# Patient Record
Sex: Female | Born: 1990 | Race: White | Hispanic: No | Marital: Married | Smoking: Never smoker
Health system: Southern US, Community
[De-identification: ages and names within clinical notes are randomized; demographics above are authoritative.]

## PROBLEM LIST (undated history)

## (undated) DIAGNOSIS — Z803 Family history of malignant neoplasm of breast: Secondary | ICD-10-CM

## (undated) DIAGNOSIS — R011 Cardiac murmur, unspecified: Secondary | ICD-10-CM

## (undated) DIAGNOSIS — Z1371 Encounter for nonprocreative screening for genetic disease carrier status: Secondary | ICD-10-CM

## (undated) DIAGNOSIS — B977 Papillomavirus as the cause of diseases classified elsewhere: Secondary | ICD-10-CM

## (undated) DIAGNOSIS — D649 Anemia, unspecified: Secondary | ICD-10-CM

## (undated) HISTORY — PX: WISDOM TOOTH EXTRACTION: SHX21

## (undated) HISTORY — DX: Family history of malignant neoplasm of breast: Z80.3

## (undated) HISTORY — DX: Encounter for nonprocreative screening for genetic disease carrier status: Z13.71

## (undated) HISTORY — DX: Papillomavirus as the cause of diseases classified elsewhere: B97.7

## (undated) HISTORY — DX: Anemia, unspecified: D64.9

## (undated) HISTORY — DX: Cardiac murmur, unspecified: R01.1

---
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
## 2014-02-22 ENCOUNTER — Observation Stay: Payer: Self-pay | Admitting: Obstetrics and Gynecology

## 2014-02-22 LAB — URINALYSIS, COMPLETE
BLOOD: NEGATIVE
Bilirubin,UR: NEGATIVE
Glucose,UR: NEGATIVE mg/dL (ref 0–75)
Hyaline Cast: 1
KETONE: NEGATIVE
NITRITE: NEGATIVE
Ph: 7 (ref 4.5–8.0)
Protein: NEGATIVE
RBC,UR: 2 /HPF (ref 0–5)
Specific Gravity: 1.008 (ref 1.003–1.030)
Squamous Epithelial: 4

## 2014-03-07 ENCOUNTER — Observation Stay: Payer: Self-pay

## 2014-03-14 ENCOUNTER — Inpatient Hospital Stay: Payer: Self-pay

## 2014-03-14 LAB — PROTEIN / CREATININE RATIO, URINE
Creatinine, Urine: 35.8 mg/dL (ref 30.0–125.0)
PROTEIN/CREAT. RATIO: 503 mg/g{creat} — AB (ref 0–200)
Protein, Random Urine: 18 mg/dL — ABNORMAL HIGH (ref 0–12)

## 2014-03-14 LAB — PIH PROFILE
ANION GAP: 10 (ref 7–16)
BUN: 6 mg/dL — ABNORMAL LOW (ref 7–18)
Calcium, Total: 8.8 mg/dL (ref 8.5–10.1)
Chloride: 107 mmol/L (ref 98–107)
Co2: 25 mmol/L (ref 21–32)
Creatinine: 0.65 mg/dL (ref 0.60–1.30)
GLUCOSE: 75 mg/dL (ref 65–99)
HCT: 34.8 % — ABNORMAL LOW (ref 35.0–47.0)
HGB: 11.3 g/dL — ABNORMAL LOW (ref 12.0–16.0)
MCH: 29.2 pg (ref 26.0–34.0)
MCHC: 32.5 g/dL (ref 32.0–36.0)
MCV: 90 fL (ref 80–100)
Osmolality: 279 (ref 275–301)
Platelet: 241 10*3/uL (ref 150–440)
Potassium: 4.1 mmol/L (ref 3.5–5.1)
RBC: 3.88 10*6/uL (ref 3.80–5.20)
RDW: 15.2 % — AB (ref 11.5–14.5)
SGOT(AST): 19 U/L (ref 15–37)
Sodium: 142 mmol/L (ref 136–145)
Uric Acid: 4.5 mg/dL (ref 2.6–6.0)
WBC: 10.5 10*3/uL (ref 3.6–11.0)

## 2014-03-15 LAB — BASIC METABOLIC PANEL
ANION GAP: 10 (ref 7–16)
BUN: 6 mg/dL — ABNORMAL LOW (ref 7–18)
CO2: 20 mmol/L — AB (ref 21–32)
CREATININE: 0.54 mg/dL — AB (ref 0.60–1.30)
Calcium, Total: 8.4 mg/dL — ABNORMAL LOW (ref 8.5–10.1)
Chloride: 107 mmol/L (ref 98–107)
EGFR (Non-African Amer.): >60
Glucose: 89 mg/dL (ref 65–99)
OSMOLALITY: 271 (ref 275–301)
Potassium: 3.9 mmol/L (ref 3.5–5.1)
Sodium: 137 mmol/L (ref 136–145)

## 2014-03-15 LAB — URIC ACID: Uric Acid: 4.8 mg/dL (ref 2.6–6.0)

## 2014-03-15 LAB — RBC: RBC: 4.1 10*6/uL (ref 3.80–5.20)

## 2014-03-15 LAB — PLATELET COUNT: Platelet: 242 10*3/uL (ref 150–440)

## 2014-03-15 LAB — HEMATOCRIT: HCT: 36.9 % (ref 35.0–47.0)

## 2014-03-15 LAB — WBC: WBC: 16.5 10*3/uL — ABNORMAL HIGH (ref 3.6–11.0)

## 2014-03-15 LAB — HEMOGLOBIN: HGB: 11.8 g/dL — AB (ref 12.0–16.0)

## 2014-03-15 LAB — SGOT (AST)(ARMC): SGOT(AST): 18 U/L (ref 15–37)

## 2014-03-16 LAB — HEMATOCRIT: HCT: 31.2 % — ABNORMAL LOW (ref 35.0–47.0)

## 2014-10-25 NOTE — H&P (Signed)
L&D Evaluation:  History Expanded:  HPI 24 yo G1 with EDD of 03/19/14 per LMP & 8 wk [place], presents at [redacted]w[redacted]d with c/o contractions and leaking fluid. Denies VB or decreased FM. PNC at [facility], early entry to care, BMI 32 with normal early 1 hr. LLP resolved at 28 wks. RH negative, rhogam received on 7/24.   Blood Type (Maternal) O negative   Maternal HIV Negative   Maternal Syphilis Ab Nonreactive   Maternal Varicella Immune   Rubella Results (Maternal) immune   Maternal T-Dap Immune   Presents with contractions   Patient's Medical History No Chronic Illness   Patient's Surgical History wisdom teeth   Medications Pre Natal Vitamins   Allergies NKDA   Social History none   Exam:  Vital Signs stable   General no apparent distress   Mental Status clear   Abdomen gravid, tender with contractions   Estimated Fetal Weight Average for gestational age   Pelvic no external lesions, 1/50/-2 to -3, unchanged after 1 hour per RN   Mebranes Intact, nitrizine negative   FHT normal rate with no decels, category 1 tracing. baseline initially 150, then to 135, moderate variability, + accelerations, no decels   Ucx initially q 3 min, irregular after resting   Impression:  Impression IUP at [redacted]w[redacted]d with contractions, not in labor   Plan:  Comments Labor precautions.  Out of work/rest today   Follow Up Appointment already scheduled. 9/14   Electronic Signatures: Burlene Arnt K (CNM)  (Signed 08-Sep-15 10:30)  Authored: L&D Evaluation   Last Updated: 08-Sep-15 10:30 by Ander Purpura (CNM)

## 2015-05-19 ENCOUNTER — Encounter: Payer: Self-pay | Admitting: *Deleted

## 2015-05-22 ENCOUNTER — Ambulatory Visit: Payer: Self-pay | Attending: Oncology | Admitting: *Deleted

## 2015-05-22 ENCOUNTER — Encounter: Payer: Self-pay | Admitting: *Deleted

## 2015-05-22 VITALS — BP 128/88 | HR 86 | Temp 98.9°F

## 2015-05-22 DIAGNOSIS — N63 Unspecified lump in unspecified breast: Secondary | ICD-10-CM

## 2015-05-22 NOTE — Patient Instructions (Signed)
Gave patient hand-out, Women Staying Healthy, Active and Well from [facility], with education on breast health, pap smears, heart and colon health. 

## 2015-05-22 NOTE — Progress Notes (Signed)
Subjective:     Patient ID: Marie Stephenson, female   DOB: 11-29-1990, 24 y.o.   MRN: [id number]  HPI   Review of Systems     Objective:   Physical Exam  Pulmonary/Chest: Right breast exhibits no inverted nipple, no mass, no nipple discharge, no skin change and no tenderness. Left breast exhibits mass. Left breast exhibits no inverted nipple, no nipple discharge, no skin change and no tenderness. Breasts are symmetrical.         Assessment:     24 year old White female referred to [facility] by Laroy Apple, NP for financial assistance for further evaluation of a left breast mass.  Patient states she felt a mass about 1 year ago after breast feeding her son.  States she thought it was just a clogged duct.  States it has gotten larger over the past year, but without insurance she did not seek any medical attention until last week.  States the nodule is painful to palpation, or if she sleeps on her stomach.  States last week, she had radiating pain from the nodule to the left axilla.   On clinical breast exam, bilateral breast have diffuse firm, fibroglandular like tissue, making it more difficult to define the area of concern.  The patient stated you could feel it best in the sitting position.  The nodule is much more superficial in the semi-fowlers position.  There is a firm, approximate 1 cm nodule at 12:00 left breast 4-5 cm from the areola that the patient states is the area of concern.    Plan:   Patient has already been referred to Dr. Bary Castilla for further evaluation and possible ultrasound.   His office was notified by Jeanella Anton the patient would be a [facility] patient so the patient would not be billed.  Will follow-up per [facility] protocol and Dr. Bary Castilla.

## 2015-05-25 ENCOUNTER — Other Ambulatory Visit: Payer: PRIVATE HEALTH INSURANCE

## 2015-05-25 ENCOUNTER — Ambulatory Visit (INDEPENDENT_AMBULATORY_CARE_PROVIDER_SITE_OTHER): Payer: PRIVATE HEALTH INSURANCE | Admitting: General Surgery

## 2015-05-25 ENCOUNTER — Encounter: Payer: Self-pay | Admitting: General Surgery

## 2015-05-25 VITALS — BP 130/80 | HR 77 | Resp 14 | Ht 62.0 in | Wt 170.0 lb

## 2015-05-25 DIAGNOSIS — N63 Unspecified lump in breast: Secondary | ICD-10-CM

## 2015-05-25 DIAGNOSIS — N6320 Unspecified lump in the left breast, unspecified quadrant: Secondary | ICD-10-CM

## 2015-05-25 NOTE — Patient Instructions (Addendum)
Continue self breast exams. Call office for any new breast issues or concerns. Check the area once a month on the same day. Follow up in 3 months.

## 2015-05-25 NOTE — Progress Notes (Signed)
Patient ID: Marie Stephenson, female   DOB: 07/15/1990, 24 y.o.   MRN: [id number]  Chief Complaint  Patient presents with  . Other    left breast mass    HPI Marie Stephenson is a 24 y.o. female here today for a evaluation of a left breast mass. Patient noticed his area about a mass about 4 months ago, 8 months after she had stopped breast feeding her son.  She states the mass has got bigger. She reports some discomfort with this if she has slept on the left side. She reports one episode of radiating like pain in the mass going into her left arm pit that lasted about 2 hours. She is here today with her husband Marie Stephenson.   I personally reviewed the patient's history. HPI  Past Medical History  Diagnosis Date  . Heart murmur     since birth    No past surgical history on file.  Family History  Problem Relation Age of Onset  . Breast cancer Maternal Grandmother 22    great grandmother  . Leukemia Maternal Grandfather     Social History Social History  Substance Use Topics  . Smoking status: Never Smoker   . Smokeless tobacco: None  . Alcohol Use: 0.0 oz/week    0 Standard drinks or equivalent per week    No Known Allergies  Current Outpatient Prescriptions  Medication Sig Dispense Refill  . norgestrel-ethinyl estradiol (LO/OVRAL,CRYSELLE) 0.3-30 MG-MCG tablet Take 1 tablet by mouth daily.     No current facility-administered medications for this visit.    Review of Systems Review of Systems  Constitutional: Negative.   Respiratory: Negative.   Cardiovascular: Negative.     Blood pressure 130/80, pulse 77, resp. rate 14, height 5\' 2"  (1.575 m), weight 170 lb (77.111 kg), last menstrual period 04/23/2015.  Physical Exam Physical Exam  Constitutional: She is oriented to person, place, and time. She appears well-developed and well-nourished.  Eyes: Conjunctivae are normal. No scleral icterus.  Neck: Neck supple.  Cardiovascular: Normal rate and  regular rhythm.   Murmur heard.  Systolic murmur is present with a grade of 1/6  Pulmonary/Chest: Effort normal and breath sounds normal. Right breast exhibits no inverted nipple, no mass, no nipple discharge, no skin change and no tenderness. Left breast exhibits no inverted nipple, no mass, no nipple discharge, no skin change and no tenderness.    Lymphadenopathy:    She has no cervical adenopathy.    She has no axillary adenopathy.  Neurological: She is alert and oriented to person, place, and time.  Skin: Skin is warm and dry.  Psychiatric: She has a normal mood and affect.    Data Reviewed Ultrasound examination of the upper portion of the left breast was completed to better assess the vague asymmetry on exam in the 11:00 position noted by the patient. No evidence of cystic or solid lesions. No architectural distortion. No vascular flow abnormalities. No images. BI-RADS-1.  Assessment    Focal prominent fat in the left breast, no evidence of underlying breast pathology.    Plan    The patient has been examining the area daily or every other day at the least. This ongoing "poking" may be keeping the area prominent, and has been discouraged. She should do monthly self-exam. Should she develop pain (not presently evident until she presses on the area) she appreciates a demonstrable change on a monthly self-exam she'll follow up early. Otherwise will have a "backstop" exam in 3  months.  The patient's husband was present for the interview, exam and discussion.    PCP: none Ref: Bertram Denver 05/26/2015, 7:57 AM

## 2015-05-26 DIAGNOSIS — N6320 Unspecified lump in the left breast, unspecified quadrant: Secondary | ICD-10-CM | POA: Insufficient documentation

## 2015-05-30 ENCOUNTER — Encounter: Payer: Self-pay | Admitting: *Deleted

## 2015-05-30 NOTE — Progress Notes (Signed)
Patient seen by Dr. Bary Castilla.  Benign findings.  No further follow-up needed.  HSIS to [place].

## 2015-08-22 ENCOUNTER — Telehealth: Payer: Self-pay | Admitting: *Deleted

## 2015-08-22 NOTE — Telephone Encounter (Signed)
Pt called to cancel her appointment for tomorrow afternoon, 08/23/15 stating that Dr. Bary Castilla had discussed with her that if she was feeling better, was not having any problems that she didn't need to be seen for her scheduled appointment. She stated that she was not having any problems but would call our office as needed.

## 2015-08-23 ENCOUNTER — Ambulatory Visit: Payer: PRIVATE HEALTH INSURANCE | Admitting: General Surgery

## 2016-10-16 ENCOUNTER — Telehealth: Payer: Self-pay | Admitting: Advanced Practice Midwife

## 2016-10-16 ENCOUNTER — Other Ambulatory Visit: Payer: Self-pay

## 2016-10-16 MED ORDER — NORGESTREL-ETHINYL ESTRADIOL 0.3-30 MG-MCG PO TABS: 1.0000 | ORAL_TABLET | Freq: Every day | ORAL | 0 refills | Status: DC

## 2016-10-16 NOTE — Telephone Encounter (Signed)
rx refilled pt aware

## 2016-10-16 NOTE — Telephone Encounter (Signed)
Pot is schedule when Rod Can for 11/20/16 and is requestinga refill on her Birthcontrol to make to her appt. Pt use [facility] in [place]

## 2016-11-20 ENCOUNTER — Ambulatory Visit (INDEPENDENT_AMBULATORY_CARE_PROVIDER_SITE_OTHER): Payer: Self-pay | Admitting: Advanced Practice Midwife

## 2016-11-20 ENCOUNTER — Encounter: Payer: Self-pay | Admitting: Advanced Practice Midwife

## 2016-11-20 VITALS — BP 118/70 | Ht 61.0 in | Wt 140.0 lb

## 2016-11-20 DIAGNOSIS — Z01419 Encounter for gynecological examination (general) (routine) without abnormal findings: Secondary | ICD-10-CM

## 2016-11-20 DIAGNOSIS — Z0000 Encounter for general adult medical examination without abnormal findings: Secondary | ICD-10-CM

## 2016-11-20 NOTE — Progress Notes (Signed)
Patient ID: Marie Stephenson, female   DOB: May 02, 1991, 26 y.o.   MRN: [id number]     Gynecology Annual Exam  PCP: Patient, No Pcp Per  Chief Complaint:  Chief Complaint  Patient presents with  . Annual Exam    History of Present Illness: Patient is a 26 y.o. G1P1001 presents for annual exam. The patient has no complaints today. She has made a decision to discontinue use of hormonal birth control. She has been on hormones since she was 26 years old and wants to have a hormone free time. She discontinued use of OCPs last month following her last period. She has questions about vasectomy. Discussion of the issues.  LMP: Patient's last menstrual period was 10/20/2016. Menarche:9 Average Interval: regular, 28 days Duration of flow: 4 days Heavy Menses: no Clots: no Intermenstrual Bleeding: no Postcoital Bleeding: no Dysmenorrhea: no  The patient is sexually active. She currently uses none for contraception. She denies dyspareunia. The patient does perform self breast exams.  There is notable family history of breast or ovarian cancer in her family. Her maternal great grandmother had breast cancer in her 58s.   The patient wears seatbelts: yes.  The patient has regular exercise: yes.  The patient admits to healthy diet- ketogenic and to adequate hydration. She is a Psychologist, counselling.  The patient denies  current symptoms of depression.    Review of Systems: Review of Systems  Constitutional: Negative.   HENT: Negative.   Eyes: Negative.   Respiratory: Negative.   Cardiovascular: Negative.   Gastrointestinal: Negative.   Genitourinary: Negative.   Musculoskeletal: Negative.   Skin: Negative.   Neurological: Negative.   Endo/Heme/Allergies: Negative.   Psychiatric/Behavioral: Negative.     Past Medical History:  Past Medical History:  Diagnosis Date  . Heart murmur    since birth  . Human papilloma virus     Past Surgical History:  Past Surgical  History:  Procedure Laterality Date  . WISDOM TOOTH EXTRACTION      Gynecologic History:  Patient's last menstrual period was 10/20/2016. Contraception: none Last Pap: Results were: unsatisfactory. The PAP before that was NIL. She has a prior history of abnormal PAP with positive HPV followed by a normal PAP.  Obstetric History: G1P1001  Family History:  Family History  Problem Relation Age of Onset  . Endometriosis Mother   . Hypertension Mother   . Breast cancer Maternal Grandmother 32       great grandmother  . Leukemia Maternal Grandfather   . Hypertension Maternal Grandfather     Social History:  Social History   Social History  . Marital status: Married    Spouse name: N/A  . Number of children: N/A  . Years of education: N/A   Occupational History  . Not on file.   Social History Main Topics  . Smoking status: Never Smoker  . Smokeless tobacco: Never Used  . Alcohol use 0.0 oz/week  . Drug use: No  . Sexual activity: Yes   Other Topics Concern  . Not on file   Social History Narrative  . No narrative on file    Allergies:  No Known Allergies  Medications: Prior to Admission medications   Medication Sig Start Date End Date Taking? Authorizing Provider  norgestrel-ethinyl estradiol (LO/OVRAL,CRYSELLE) 0.3-30 MG-MCG tablet Take 1 tablet by mouth daily. Patient not taking: Reported on 11/20/2016 10/16/16   Rod Can, CNM    Physical Exam Vitals: Blood pressure 118/70, height 5\' 1"  (  1.549 m), weight 140 lb (63.5 kg), last menstrual period 10/20/2016.  General: NAD HEENT: normocephalic, anicteric Thyroid: no enlargement, no palpable nodules Pulmonary: No increased work of breathing, CTAB Cardiovascular: RRR, distal pulses 2+ Breast: Breast symmetrical, no tenderness, no palpable nodules or masses, no skin or nipple retraction present, no nipple discharge.  No axillary or supraclavicular lymphadenopathy. Abdomen: NABS, soft, non-tender,  non-distended.  Umbilicus without lesions.  No hepatomegaly, splenomegaly or masses palpable. No evidence of hernia  Genitourinary: deferred for no complaints and no PAP done Extremities: no edema, erythema, or tenderness Neurologic: Grossly intact Psychiatric: mood appropriate, affect full   Assessment: 26 y.o. G1P1001 Well woman exam  Plan: Problem List Items Addressed This Visit    None    Visit Diagnoses    Well woman exam with routine gynecological exam    -  Primary   Relevant Orders   CBC with Differential/Platelet   Lipid panel   Blood tests for routine general physical examination       Relevant Orders   CBC with Differential/Platelet   Lipid panel      1) 4) Gardasil Series discussed and if applicable offered to patient - Patient has previously completed 3 shot series   2) STI screening was offered and declined   3) [facility] guidelines and rational discussed.  Patient opts for yearly screening interval. Patient declines PAP today due to no insurance   4) Contraception: patient chooses to use no hormonal birth control or copper IUD. She plans to use condoms for now  5) Continue healthy lifestyle diet and exercise   6) Follow up 1 year for routine annual exam   Rod Can, CNM

## 2016-11-21 LAB — CBC WITH DIFFERENTIAL/PLATELET
BASOS: 0 %
Basophils Absolute: 0 10*3/uL (ref 0.0–0.2)
EOS (ABSOLUTE): 0.1 10*3/uL (ref 0.0–0.4)
Eos: 1 %
Hematocrit: 43.1 % (ref 34.0–46.6)
Hemoglobin: 14.8 g/dL (ref 11.1–15.9)
IMMATURE GRANS (ABS): 0 10*3/uL (ref 0.0–0.1)
Immature Granulocytes: 0 %
LYMPHS: 41 %
Lymphocytes Absolute: 2.1 10*3/uL (ref 0.7–3.1)
MCH: 31 pg (ref 26.6–33.0)
MCHC: 34.3 g/dL (ref 31.5–35.7)
MCV: 90 fL (ref 79–97)
MONOS ABS: 0.3 10*3/uL (ref 0.1–0.9)
Monocytes: 6 %
Neutrophils Absolute: 2.6 10*3/uL (ref 1.4–7.0)
Neutrophils: 52 %
Platelets: 253 10*3/uL (ref 150–379)
RBC: 4.77 x10E6/uL (ref 3.77–5.28)
RDW: 13.1 % (ref 12.3–15.4)
WBC: 5 10*3/uL (ref 3.4–10.8)

## 2016-11-21 LAB — LIPID PANEL
Chol/HDL Ratio: 3.7 ratio (ref 0.0–4.4)
Cholesterol, Total: 215 mg/dL — ABNORMAL HIGH (ref 100–199)
HDL: 58 mg/dL (ref >39)
LDL Calculated: 147 mg/dL — ABNORMAL HIGH (ref 0–99)
TRIGLYCERIDES: 48 mg/dL (ref 0–149)
VLDL CHOLESTEROL CAL: 10 mg/dL (ref 5–40)

## 2018-05-09 ENCOUNTER — Encounter: Payer: Self-pay | Admitting: Gynecology

## 2018-05-09 ENCOUNTER — Ambulatory Visit (INDEPENDENT_AMBULATORY_CARE_PROVIDER_SITE_OTHER)
Admission: EM | Admit: 2018-05-09 | Discharge: 2018-05-09 | Disposition: A | Discharge status: Home / Self Care | Payer: PRIVATE HEALTH INSURANCE | Source: Home / Self Care | Attending: Family Medicine | Admitting: Family Medicine

## 2018-05-09 ENCOUNTER — Other Ambulatory Visit: Payer: Self-pay

## 2018-05-09 DIAGNOSIS — R1084 Generalized abdominal pain: Secondary | ICD-10-CM | POA: Insufficient documentation

## 2018-05-09 DIAGNOSIS — R197 Diarrhea, unspecified: Secondary | ICD-10-CM | POA: Insufficient documentation

## 2018-05-09 DIAGNOSIS — R110 Nausea: Secondary | ICD-10-CM | POA: Diagnosis not present

## 2018-05-09 DIAGNOSIS — K8050 Calculus of bile duct without cholangitis or cholecystitis without obstruction: Secondary | ICD-10-CM

## 2018-05-09 DIAGNOSIS — R1011 Right upper quadrant pain: Secondary | ICD-10-CM | POA: Diagnosis present

## 2018-05-09 LAB — CBC WITH DIFFERENTIAL/PLATELET
Abs Immature Granulocytes: 0.02 10*3/uL (ref 0.00–0.07)
BASOS ABS: 0 10*3/uL (ref 0.0–0.1)
Basophils Relative: 0 %
EOS ABS: 0.1 10*3/uL (ref 0.0–0.5)
EOS PCT: 1 %
HEMATOCRIT: 40.2 % (ref 36.0–46.0)
Hemoglobin: 14.1 g/dL (ref 12.0–15.0)
Immature Granulocytes: 0 %
LYMPHS ABS: 1.8 10*3/uL (ref 0.7–4.0)
Lymphocytes Relative: 24 %
MCH: 30.9 pg (ref 26.0–34.0)
MCHC: 35.1 g/dL (ref 30.0–36.0)
MCV: 88.2 fL (ref 80.0–100.0)
MONO ABS: 0.4 10*3/uL (ref 0.1–1.0)
Monocytes Relative: 6 %
NRBC: 0 % (ref 0.0–0.2)
Neutro Abs: 5.1 10*3/uL (ref 1.7–7.7)
Neutrophils Relative %: 69 %
Platelets: 249 10*3/uL (ref 150–400)
RBC: 4.56 MIL/uL (ref 3.87–5.11)
RDW: 11.9 % (ref 11.5–15.5)
WBC: 7.3 10*3/uL (ref 4.0–10.5)

## 2018-05-09 LAB — COMPREHENSIVE METABOLIC PANEL
ALK PHOS: 35 U/L — AB (ref 38–126)
ALT: 11 U/L (ref 0–44)
AST: 12 U/L — AB (ref 15–41)
Albumin: 4.1 g/dL (ref 3.5–5.0)
Anion gap: 7 (ref 5–15)
BUN: 18 mg/dL (ref 6–20)
CALCIUM: 9.1 mg/dL (ref 8.9–10.3)
CO2: 29 mmol/L (ref 22–32)
CREATININE: 0.71 mg/dL (ref 0.44–1.00)
Chloride: 102 mmol/L (ref 98–111)
Glucose, Bld: 89 mg/dL (ref 70–99)
Potassium: 4.1 mmol/L (ref 3.5–5.1)
SODIUM: 138 mmol/L (ref 135–145)
Total Bilirubin: 0.7 mg/dL (ref 0.3–1.2)
Total Protein: 6.9 g/dL (ref 6.5–8.1)

## 2018-05-09 NOTE — ED Triage Notes (Signed)
Per patient with abdominal pain radiates to her ride side. Per patient with diarrhea x 4 days

## 2018-05-09 NOTE — ED Triage Notes (Signed)
Patient reports right upper quad abdominal pain that not radiates to mid abdomen.  States seen at Urgent Care earlier in day and told probably bilary but instructed to come to ED if continued.

## 2018-05-09 NOTE — Discharge Instructions (Signed)
Recommend clear liquids for today, then advance diet slowly as tolerated, starting with bland foods; avoid fatty foods Over the counter tylenol/ibuprofen as needed

## 2018-05-09 NOTE — ED Provider Notes (Signed)
[facility]    CSN: [id number] Arrival date & time: 05/09/18  1342     History   Chief Complaint Chief Complaint  Patient presents with  . Abdominal Pain    HPI Marie Stephenson is a 27 y.o. female.   The history is provided by the patient.  Abdominal Pain  Pain location:  RUQ Pain quality: cramping   Pain radiates to:  R flank Pain severity:  Mild Onset quality:  Sudden Duration:  4 days Timing:  Constant Progression:  Worsening Chronicity:  New Context: not alcohol use, not awakening from sleep, not diet changes, not eating, not laxative use, not medication withdrawal, not previous surgeries, not recent illness, not recent sexual activity, not recent travel, not retching, not sick contacts, not suspicious food intake and not trauma   Relieved by:  None tried Ineffective treatments:  None tried Associated symptoms: diarrhea (watery) and nausea   Associated symptoms: no anorexia, no belching, no chest pain, no chills, no constipation, no cough, no dysuria, no fatigue, no fever, no flatus, no hematemesis, no hematochezia, no hematuria, no melena, no shortness of breath, no sore throat, no vaginal bleeding, no vaginal discharge and no vomiting   Risk factors: no alcohol abuse, no aspirin use, not elderly, has not had multiple surgeries, no NSAID use, not obese, not pregnant and no recent hospitalization     Past Medical History:  Diagnosis Date  . Heart murmur    since birth  . Human papilloma virus     Patient Active Problem List   Diagnosis Date Noted  . Breast mass, left 05/26/2015    Past Surgical History:  Procedure Laterality Date  . WISDOM TOOTH EXTRACTION      OB History    Gravida  1   Para  1   Term  1   Preterm      AB      Living  1     SAB      TAB      Ectopic      Multiple      Live Births  1        Obstetric Comments  Menstrual age: 65  Age 1st Pregnancy: 71          Home Medications     Prior to Admission medications   Not on File    Family History Family History  Problem Relation Age of Onset  . Endometriosis Mother   . Hypertension Mother   . Breast cancer Maternal Grandmother 68       great grandmother  . Leukemia Maternal Grandfather   . Hypertension Maternal Grandfather     Social History Social History   Tobacco Use  . Smoking status: Never Smoker  . Smokeless tobacco: Never Used  Substance Use Topics  . Alcohol use: Yes    Alcohol/week: 0.0 standard drinks  . Drug use: No     Allergies   Patient has no known allergies.   Review of Systems Review of Systems  Constitutional: Negative for chills, fatigue and fever.  HENT: Negative for sore throat.   Respiratory: Negative for cough and shortness of breath.   Cardiovascular: Negative for chest pain.  Gastrointestinal: Positive for abdominal pain, diarrhea (watery) and nausea. Negative for anorexia, constipation, flatus, hematemesis, hematochezia, melena and vomiting.  Genitourinary: Negative for dysuria, hematuria, vaginal bleeding and vaginal discharge.     Physical Exam Triage Vital Signs ED Triage Vitals  Enc Vitals Group  BP --      Pulse Rate 05/09/18 1359 80     Resp 05/09/18 1359 16     Temp 05/09/18 1359 98.4 F (36.9 C)     Temp Source 05/09/18 1359 Oral     SpO2 05/09/18 1359 100 %     Weight 05/09/18 1400 142 lb (64.4 kg)     Height 05/09/18 1400 5\' 1"  (1.549 m)     Head Circumference --      Peak Flow --      Pain Score 05/09/18 1400 4     Pain Loc --      Pain Edu? --      Excl. in [place]? --    No data found.  Updated Vital Signs Pulse 80   Temp 98.4 F (36.9 C) (Oral)   Resp 16   Ht 5\' 1"  (1.549 m)   Wt 64.4 kg   LMP 05/08/2018   SpO2 100%   BMI 26.83 kg/m   Visual Acuity Right Eye Distance:   Left Eye Distance:   Bilateral Distance:    Right Eye Near:   Left Eye Near:    Bilateral Near:     Physical Exam  Constitutional: She appears  well-developed and well-nourished. No distress.  Abdominal: Soft. Bowel sounds are normal. She exhibits no distension and no mass. There is tenderness (mild right upper quadrant; no rebound or guarding). There is no rebound and no guarding.  Skin: She is not diaphoretic.  Nursing note and vitals reviewed.    UC Treatments / Results  Labs (all labs ordered are listed, but only abnormal results are displayed) Labs Reviewed  COMPREHENSIVE METABOLIC PANEL - Abnormal; Notable for the following components:      Result Value   AST 12 (*)    Alkaline Phosphatase 35 (*)    All other components within normal limits  CBC WITH DIFFERENTIAL/PLATELET    EKG None  Radiology No results found.  Procedures Procedures (including critical care time)  Medications Ordered in UC Medications - No data to display  Initial Impression / Assessment and Plan / UC Course  I have reviewed the triage vital signs and the nursing notes.  Pertinent labs & imaging results that were available during my care of the patient were reviewed by me and considered in my medical decision making (see chart for details).      Final Clinical Impressions(s) / UC Diagnoses   Final diagnoses:  Biliary colic     Discharge Instructions     Recommend clear liquids for today, then advance diet slowly as tolerated, starting with bland foods; avoid fatty foods Over the counter tylenol/ibuprofen as needed    ED Prescriptions    None     1. Lab results and diagnosis reviewed with patient 2 Recommend supportive treatment as above 3. Follow-up prn if symptoms worsen or don't improve   Controlled Substance Prescriptions [place] Controlled Substance Registry consulted? Not Applicable   Norval Gable, MD 05/09/18 1558

## 2018-05-10 ENCOUNTER — Emergency Department: Payer: PRIVATE HEALTH INSURANCE

## 2018-05-10 ENCOUNTER — Emergency Department
Admission: EM | Admit: 2018-05-10 | Discharge: 2018-05-10 | Disposition: A | Discharge status: Home / Self Care | Payer: PRIVATE HEALTH INSURANCE | Attending: Emergency Medicine | Admitting: Emergency Medicine

## 2018-05-10 ENCOUNTER — Encounter: Payer: Self-pay | Admitting: Radiology

## 2018-05-10 DIAGNOSIS — K529 Noninfective gastroenteritis and colitis, unspecified: Secondary | ICD-10-CM

## 2018-05-10 DIAGNOSIS — R110 Nausea: Secondary | ICD-10-CM

## 2018-05-10 DIAGNOSIS — R1011 Right upper quadrant pain: Secondary | ICD-10-CM

## 2018-05-10 DIAGNOSIS — R1084 Generalized abdominal pain: Secondary | ICD-10-CM

## 2018-05-10 DIAGNOSIS — R197 Diarrhea, unspecified: Secondary | ICD-10-CM

## 2018-05-10 LAB — GASTROINTESTINAL PANEL BY PCR, STOOL (REPLACES STOOL CULTURE)
ADENOVIRUS F40/41: NOT DETECTED
ASTROVIRUS: NOT DETECTED
CAMPYLOBACTER SPECIES: NOT DETECTED
CRYPTOSPORIDIUM: NOT DETECTED
CYCLOSPORA CAYETANENSIS: NOT DETECTED
ENTAMOEBA HISTOLYTICA: NOT DETECTED
ENTEROPATHOGENIC E COLI (EPEC): NOT DETECTED
ENTEROTOXIGENIC E COLI (ETEC): NOT DETECTED
Enteroaggregative E coli (EAEC): NOT DETECTED
Giardia lamblia: NOT DETECTED
Norovirus GI/GII: NOT DETECTED
PLESIMONAS SHIGELLOIDES: NOT DETECTED
Rotavirus A: NOT DETECTED
Salmonella species: NOT DETECTED
Sapovirus (I, II, IV, and V): NOT DETECTED
Shiga like toxin producing E coli (STEC): NOT DETECTED
Shigella/Enteroinvasive E coli (EIEC): NOT DETECTED
VIBRIO CHOLERAE: NOT DETECTED
VIBRIO SPECIES: NOT DETECTED
YERSINIA ENTEROCOLITICA: NOT DETECTED

## 2018-05-10 LAB — COMPREHENSIVE METABOLIC PANEL
ALBUMIN: 4 g/dL (ref 3.5–5.0)
ALT: 11 U/L (ref 0–44)
AST: 11 U/L — AB (ref 15–41)
Alkaline Phosphatase: 34 U/L — ABNORMAL LOW (ref 38–126)
Anion gap: 10 (ref 5–15)
BUN: 18 mg/dL (ref 6–20)
CALCIUM: 8.8 mg/dL — AB (ref 8.9–10.3)
CO2: 28 mmol/L (ref 22–32)
Chloride: 102 mmol/L (ref 98–111)
Creatinine, Ser: 0.69 mg/dL (ref 0.44–1.00)
GFR calc Af Amer: >60 mL/min (ref >60)
GFR calc non Af Amer: >60 mL/min (ref >60)
GLUCOSE: 90 mg/dL (ref 70–99)
Potassium: 3.8 mmol/L (ref 3.5–5.1)
SODIUM: 140 mmol/L (ref 135–145)
TOTAL PROTEIN: 6.5 g/dL (ref 6.5–8.1)
Total Bilirubin: 0.5 mg/dL (ref 0.3–1.2)

## 2018-05-10 LAB — C DIFFICILE QUICK SCREEN W PCR REFLEX
C DIFFICLE (CDIFF) ANTIGEN: NEGATIVE
C Diff interpretation: NOT DETECTED
C Diff toxin: NEGATIVE

## 2018-05-10 LAB — URINALYSIS, COMPLETE (UACMP) WITH MICROSCOPIC
Bilirubin Urine: NEGATIVE
Glucose, UA: NEGATIVE mg/dL
KETONES UR: 5 mg/dL — AB
LEUKOCYTES UA: NEGATIVE
Nitrite: NEGATIVE
PH: 6 (ref 5.0–8.0)
PROTEIN: NEGATIVE mg/dL
SPECIFIC GRAVITY, URINE: 1.018 (ref 1.005–1.030)

## 2018-05-10 LAB — CBC
HCT: 44.8 % (ref 36.0–46.0)
Hemoglobin: 15.6 g/dL — ABNORMAL HIGH (ref 12.0–15.0)
MCH: 31 pg (ref 26.0–34.0)
MCHC: 34.8 g/dL (ref 30.0–36.0)
MCV: 88.9 fL (ref 80.0–100.0)
NRBC: 0 % (ref 0.0–0.2)
Platelets: 277 10*3/uL (ref 150–400)
RBC: 5.04 MIL/uL (ref 3.87–5.11)
RDW: 11.8 % (ref 11.5–15.5)
WBC: 8.9 10*3/uL (ref 4.0–10.5)

## 2018-05-10 LAB — PREGNANCY, URINE: Preg Test, Ur: NEGATIVE

## 2018-05-10 LAB — LIPASE, BLOOD: Lipase: 47 U/L (ref 11–51)

## 2018-05-10 MED ORDER — CIPROFLOXACIN HCL 500 MG PO TABS: 500.0000 mg | ORAL_TABLET | Freq: Two times a day (BID) | ORAL | 0 refills | Status: DC

## 2018-05-10 MED ORDER — SODIUM CHLORIDE 0.9 % IV BOLUS
1000.0000 mL | Freq: Once | INTRAVENOUS | Status: AC
  Administered 2018-05-10: 1000 mL via INTRAVENOUS

## 2018-05-10 MED ORDER — IOHEXOL 300 MG/ML  SOLN
75.0000 mL | Freq: Once | INTRAMUSCULAR | Status: AC | PRN
  Administered 2018-05-10: 75 mL via INTRAVENOUS

## 2018-05-10 MED ORDER — ONDANSETRON 4 MG PO TBDP: 4.0000 mg | ORAL_TABLET | Freq: Three times a day (TID) | ORAL | 0 refills | Status: DC | PRN

## 2018-05-10 MED ORDER — DICYCLOMINE HCL 20 MG PO TABS: 20.0000 mg | ORAL_TABLET | Freq: Four times a day (QID) | ORAL | 0 refills | Status: DC | PRN

## 2018-05-10 MED ORDER — CIPROFLOXACIN HCL 500 MG PO TABS
500.0000 mg | ORAL_TABLET | Freq: Once | ORAL | Status: AC
  Administered 2018-05-10: 500 mg via ORAL
  Filled 2018-05-10: qty 1

## 2018-05-10 MED ORDER — ONDANSETRON HCL 4 MG/2ML IJ SOLN
4.0000 mg | Freq: Once | INTRAMUSCULAR | Status: DC
  Filled 2018-05-10: qty 2

## 2018-05-10 MED ORDER — MORPHINE SULFATE (PF) 4 MG/ML IV SOLN
4.0000 mg | Freq: Once | INTRAVENOUS | Status: DC
  Filled 2018-05-10: qty 1

## 2018-05-10 MED ORDER — IOPAMIDOL (ISOVUE-300) INJECTION 61%
30.0000 mL | Freq: Once | INTRAVENOUS | Status: AC | PRN
  Administered 2018-05-10: 30 mL via ORAL

## 2018-05-10 NOTE — ED Notes (Signed)
Pt says currently her pain is 4/10; when she has "an episode", her pain is 7/10

## 2018-05-10 NOTE — ED Notes (Addendum)
Upper right Abdominal pain started Friday night. Pt also has had diarrhea x 4 days. Feeling like cramping. Pt alert and oriented x 4. Family at bed. Pt went to urgent care yesterday and recommended if pain gets worse to go to ER. Tender to touch. PT family has Hx of IBS

## 2018-05-10 NOTE — ED Notes (Signed)
Pt to US via wheelchair.

## 2018-05-10 NOTE — Discharge Instructions (Signed)
1.  Take antibiotic as prescribed (Cipro 500 mg twice daily x5 days). 2.  You may take medicines as needed for abdominal discomfort and nausea (Bentyl/Zofran #20). 3.  BRAT diet x5 days, then slowly advance diet as tolerated. 4.  Drink plenty of fluids daily. 5.  Return to the ER for worsening symptoms, persistent vomiting, difficulty breathing or other concerns.

## 2018-05-10 NOTE — ED Provider Notes (Signed)
[facility] Emergency Department Provider Note   ____________________________________________   First MD Initiated Contact with Patient 05/10/18 [phone]     (approximate)  I have reviewed the triage vital signs and the nursing notes.   HISTORY  Chief Complaint Abdominal Pain    HPI Marie Stephenson is a 27 y.o. female sent to the ED from urgent care for evaluation of biliary colic.  Patient reports a 4-day history of right upper quadrant abdominal pain mainly at night.  Over the course of the 4 days, pain is now generalized.  Symptoms associated with nausea and diarrhea.  Denies associated fever, chills, chest pain, shortness of breath, dysuria.  Denies recent travel, trauma or antibiotic use.   Past Medical History:  Diagnosis Date  . Heart murmur    since birth  . Human papilloma virus     Patient Active Problem List   Diagnosis Date Noted  . Breast mass, left 05/26/2015    Past Surgical History:  Procedure Laterality Date  . WISDOM TOOTH EXTRACTION      Prior to Admission medications   Medication Sig Start Date End Date Taking? Authorizing Provider  ciprofloxacin (CIPRO) 500 MG tablet Take 1 tablet (500 mg total) by mouth 2 (two) times daily. 05/10/18   Paulette Blanch, MD  dicyclomine (BENTYL) 20 MG tablet Take 1 tablet (20 mg total) by mouth every 6 (six) hours as needed. 05/10/18   Paulette Blanch, MD  ondansetron (ZOFRAN ODT) 4 MG disintegrating tablet Take 1 tablet (4 mg total) by mouth every 8 (eight) hours as needed for nausea or vomiting. 05/10/18   Paulette Blanch, MD    Allergies Patient has no known allergies.  Family History  Problem Relation Age of Onset  . Endometriosis Mother   . Hypertension Mother   . Breast cancer Maternal Grandmother 64       great grandmother  . Leukemia Maternal Grandfather   . Hypertension Maternal Grandfather     Social History Social History   Tobacco Use  . Smoking status: Never Smoker    . Smokeless tobacco: Never Used  Substance Use Topics  . Alcohol use: Yes    Alcohol/week: 0.0 standard drinks  . Drug use: No    Review of Systems  Constitutional: No fever/chills Eyes: No visual changes. ENT: No sore throat. Cardiovascular: Denies chest pain. Respiratory: Denies shortness of breath. Gastrointestinal: Nauseated for abdominal pain and nausea, no vomiting.  Positive for diarrhea.  No constipation. Genitourinary: Negative for dysuria. Musculoskeletal: Negative for back pain. Skin: Negative for rash. Neurological: Negative for headaches, focal weakness or numbness.   ____________________________________________   PHYSICAL EXAM:  VITAL SIGNS: ED Triage Vitals  Enc Vitals Group     BP 05/09/18 2338 138/76     Pulse Rate 05/09/18 2338 98     Resp 05/09/18 2338 18     Temp 05/09/18 2338 98.6 F (37 C)     Temp Source 05/09/18 2338 Oral     SpO2 05/09/18 2338 97 %     Weight 05/09/18 2337 142 lb (64.4 kg)     Height 05/09/18 2337 5\' 1"  (1.549 m)     Head Circumference --      Peak Flow --      Pain Score 05/09/18 2337 8     Pain Loc --      Pain Edu? --      Excl. in [place]? --     Constitutional: Alert and oriented.  Well appearing and in no acute distress. Eyes: Conjunctivae are normal. PERRL. EOMI. Head: Atraumatic. Nose: No congestion/rhinnorhea. Mouth/Throat: Mucous membranes are moist.  Oropharynx non-erythematous. Neck: No stridor.   Cardiovascular: Normal rate, regular rhythm. Grossly normal heart sounds.  Good peripheral circulation. Respiratory: Normal respiratory effort.  No retractions. Lungs CTAB. Gastrointestinal: Soft and mildly tender to palpation diffusely without rebound or guarding. No distention. No abdominal bruits. No CVA tenderness. Musculoskeletal: No lower extremity tenderness nor edema.  No joint effusions. Neurologic:  Normal speech and language. No gross focal neurologic deficits are appreciated. No gait instability. Skin:   Skin is warm, dry and intact. No rash noted. Psychiatric: Mood and affect are normal. Speech and behavior are normal.  ____________________________________________   LABS (all labs ordered are listed, but only abnormal results are displayed)  Labs Reviewed  COMPREHENSIVE METABOLIC PANEL - Abnormal; Notable for the following components:      Result Value   Calcium 8.8 (*)    AST 11 (*)    Alkaline Phosphatase 34 (*)    All other components within normal limits  CBC - Abnormal; Notable for the following components:   Hemoglobin 15.6 (*)    All other components within normal limits  URINALYSIS, COMPLETE (UACMP) WITH MICROSCOPIC - Abnormal; Notable for the following components:   Color, Urine YELLOW (*)    APPearance CLOUDY (*)    Hgb urine dipstick LARGE (*)    Ketones, ur 5 (*)    Bacteria, UA RARE (*)    All other components within normal limits  C DIFFICILE QUICK SCREEN W PCR REFLEX  GASTROINTESTINAL PANEL BY PCR, STOOL (REPLACES STOOL CULTURE)  LIPASE, BLOOD  PREGNANCY, URINE   ____________________________________________  EKG  None ____________________________________________  RADIOLOGY  ED MD interpretation: Unremarkable ultrasound; CT demonstrates enteritis  Official radiology report(s): Ct Abdomen Pelvis W Contrast  Result Date: 05/10/2018 CLINICAL DATA:  RIGHT abdominal pain since Friday night, diarrhea for 4 days, cramping EXAM: CT ABDOMEN AND PELVIS WITH CONTRAST TECHNIQUE: Multidetector CT imaging of the abdomen and pelvis was performed using the standard protocol following bolus administration of intravenous contrast. CONTRAST:  27mL OMNIPAQUE IOHEXOL 300 MG/ML  SOLN COMPARISON:  None FINDINGS: Lower chest: Lung bases clear Hepatobiliary: Gallbladder and liver normal appearance Pancreas: Normal appearance Spleen: Normal appearance Adrenals/Urinary Tract: Adrenal glands normal appearance. Small LEFT renal cyst and BILATERAL nonobstructing renal calculi. No  hydronephrosis or ureteral abnormalities. Well distended otherwise normal appearing urinary bladder. Stomach/Bowel: Bowel wall thickening of multiple small bowel loops in the RIGHT mid abdomen and upper pelvis associated with mesenteric edema. Findings are consistent with enteritis. Stomach decompressed and unremarkable. Colon and remaining small bowel loops normal appearance. Normal appendix opacified by contrast though mild peri appendiceal infiltration is noted, likely secondary. Vascular/Lymphatic: Vascular structures patent on non targeted exam. Aorta normal caliber. Few normal sized mesenteric lymph nodes without abdominal or pelvic adenopathy. Reproductive: Unremarkable uterus and ovaries Other: Free fluid in pelvis, RIGHT pericolic gutter, interloop, and perihepatic. No free air or hernia. Musculoskeletal: Unremarkable IMPRESSION: Bowel wall thickening of multiple small bowel loops in the mid abdomen and upper pelvis compatible with enteritis; differential diagnosis would include infection and inflammatory bowel disease. Associated scattered free intraperitoneal fluid without free air to suggest perforation. Electronically Signed   By: Lavonia Dana M.D.   On: 05/10/2018 07:16   US Abdomen Limited Ruq  Result Date: 05/10/2018 CLINICAL DATA:  Right upper quadrant pain for 4 days. EXAM: ULTRASOUND ABDOMEN LIMITED RIGHT UPPER QUADRANT COMPARISON:  None. FINDINGS: Gallbladder: No gallstones or wall thickening visualized. No sonographic Murphy sign noted by sonographer. Common bile duct: Diameter: 3.9 mm, normal Liver: No focal lesion identified. Within normal limits in parenchymal echogenicity. Portal vein is patent on color Doppler imaging with normal direction of blood flow towards the liver. IMPRESSION: Normal examination. Electronically Signed   By: Lucienne Capers M.D.   On: 05/10/2018 02:24    ____________________________________________   PROCEDURES  Procedure(s) performed:  None  Procedures  Critical Care performed: No  ____________________________________________   INITIAL IMPRESSION / ASSESSMENT AND PLAN / ED COURSE  As part of my medical decision making, I reviewed the following data within the [place] notes reviewed and incorporated, Labs reviewed, Radiograph reviewed and Notes from prior ED visits   27 year old female who presents with abdominal pain, nausea and diarrhea.  Family history of Crohn's disease. Differential diagnosis includes, but is not limited to, biliary disease (biliary colic, acute cholecystitis, cholangitis, choledocholithiasis, etc), intrathoracic causes for epigastric abdominal pain including ACS, gastritis, duodenitis, pancreatitis, small bowel or large bowel obstruction, abdominal aortic aneurysm, hernia, ulcer(s), IBD, IBS, diverticulitis, colitis, etc.  Laboratory results unremarkable.  Ultrasound negative for biliary disease.  Will initiate IV fluid resuscitation, administer 4 mg IV morphine for pain paired with 4 mg IV Zofran for nausea.  Will proceed with CT abdomen/pelvis.  Collect stool specimen if patient is able to produce.   Clinical Course as of May 10 732  Nancy Fetter May 10, 2018  0720 Patient on CT result.  Awaiting rest of stool specimen.  We discussed if those are unremarkable, we can empirically place her on Cipro for symptomatic relief.  Anticipate discharge home after results of bio fire panel.   [JS]  [id number] Bio fire has returned negative.  Will discharge home with GI follow-up.  Prescriptions for Cipro, Bentyl and Zofran to use as needed.  Strict return precautions given.  Patient and family members verbalized understanding and agree with plan of care.   [JS]    Clinical Course User Index [JS] Paulette Blanch, MD     ____________________________________________   FINAL CLINICAL IMPRESSION(S) / ED DIAGNOSES  Final diagnoses:  Generalized abdominal pain  Diarrhea, unspecified type   Nausea  Enteritis     ED Discharge Orders         Ordered    dicyclomine (BENTYL) 20 MG tablet  Every 6 hours PRN     05/10/18 0723    ciprofloxacin (CIPRO) 500 MG tablet  2 times daily     05/10/18 0723    ondansetron (ZOFRAN ODT) 4 MG disintegrating tablet  Every 8 hours PRN     05/10/18 5465           Note:  This document was prepared using Dragon voice recognition software and may include unintentional dictation errors.    Paulette Blanch, MD 05/10/18 [phone]

## 2018-08-14 ENCOUNTER — Encounter: Payer: Self-pay | Admitting: Advanced Practice Midwife

## 2018-08-14 ENCOUNTER — Other Ambulatory Visit (HOSPITAL_COMMUNITY)
Admission: RE | Admit: 2018-08-14 | Discharge: 2018-08-14 | Disposition: A | Discharge status: Home / Self Care | Payer: PRIVATE HEALTH INSURANCE | Source: Ambulatory Visit | Attending: Advanced Practice Midwife | Admitting: Advanced Practice Midwife

## 2018-08-14 ENCOUNTER — Ambulatory Visit (INDEPENDENT_AMBULATORY_CARE_PROVIDER_SITE_OTHER): Payer: PRIVATE HEALTH INSURANCE | Admitting: Advanced Practice Midwife

## 2018-08-14 VITALS — BP 114/70 | Ht 61.0 in | Wt 138.0 lb

## 2018-08-14 DIAGNOSIS — Z8639 Personal history of other endocrine, nutritional and metabolic disease: Secondary | ICD-10-CM

## 2018-08-14 DIAGNOSIS — Z113 Encounter for screening for infections with a predominantly sexual mode of transmission: Secondary | ICD-10-CM

## 2018-08-14 DIAGNOSIS — Z01419 Encounter for gynecological examination (general) (routine) without abnormal findings: Secondary | ICD-10-CM | POA: Diagnosis not present

## 2018-08-14 DIAGNOSIS — Z124 Encounter for screening for malignant neoplasm of cervix: Secondary | ICD-10-CM | POA: Insufficient documentation

## 2018-08-14 DIAGNOSIS — Z0000 Encounter for general adult medical examination without abnormal findings: Secondary | ICD-10-CM

## 2018-08-14 NOTE — Progress Notes (Signed)
Patient ID: Marie Stephenson, female   DOB: 08/03/1990, 28 y.o.   MRN: [id number]     Gynecology Annual Exam   PCP: Patient, No Pcp Per  Chief Complaint:  Chief Complaint  Patient presents with  . Annual Exam    History of Present Illness: Patient is a 29 y.o. G1P1001 presents for annual exam. The patient has complaint today of a mole that is near her anus that she has known of for approximately 2 years. She denies any itching, irritation or pain. She has not seen a dermatologist. She reports feeling well since being off birth control hormones for the past 1.5 years. She requests hormone testing today due to past history of hormone imbalance.   LMP: Patient's last menstrual period was 07/10/2018. Average Interval: regular, 28-35 days Duration of flow: 5 days Heavy Menses: no Clots: no Intermenstrual Bleeding: no Postcoital Bleeding: no Dysmenorrhea: no  The patient is sexually active. She currently uses condoms for contraception. She denies dyspareunia.  The patient does perform self breast exams.  There is notable family history of breast or ovarian cancer in her family. Her maternal great grandmother was diagnosed with breast cancer in her 61's. Patient accepts [facility] testing today.  The patient wears seatbelts: yes.   The patient has regular exercise: She works out- mostly lifting and some cardio- 5-6 days per week. She admits healthy diet and adequate hydration. She admits 6-7 hours of sleep per night.    The patient denies current symptoms of depression.    Review of Systems: Review of Systems  Constitutional: Negative.   HENT: Negative.   Eyes: Negative.   Respiratory: Negative.   Cardiovascular: Negative.   Gastrointestinal: Negative.   Genitourinary: Negative.   Musculoskeletal: Negative.   Skin:       Mole at anus  Neurological: Negative.   Endo/Heme/Allergies:       Easy bruising  Psychiatric/Behavioral: Negative.     Past Medical History:  Past  Medical History:  Diagnosis Date  . Heart murmur    since birth  . Human papilloma virus     Past Surgical History:  Past Surgical History:  Procedure Laterality Date  . WISDOM TOOTH EXTRACTION      Gynecologic History:  Patient's last menstrual period was 07/10/2018. Contraception: condoms Last Pap: 07/19/2015 Results were: unsatisfactory for evaluation   Obstetric History: G1P1001  Family History:  Family History  Problem Relation Age of Onset  . Endometriosis Mother   . Hypertension Mother   . Breast cancer Maternal Grandmother 87       great grandmother  . Leukemia Maternal Grandfather   . Hypertension Maternal Grandfather     Social History:  Social History   Socioeconomic History  . Marital status: Married    Spouse name: Not on file  . Number of children: Not on file  . Years of education: Not on file  . Highest education level: Not on file  Occupational History  . Not on file  Social Needs  . Financial resource strain: Not on file  . Food insecurity:    Worry: Not on file    Inability: Not on file  . Transportation needs:    Medical: Not on file    Non-medical: Not on file  Tobacco Use  . Smoking status: Never Smoker  . Smokeless tobacco: Never Used  Substance and Sexual Activity  . Alcohol use: Yes    Alcohol/week: 0.0 standard drinks  . Drug use: No  . Sexual activity:  Yes    Birth control/protection: Condom  Lifestyle  . Physical activity:    Days per week: Not on file    Minutes per session: Not on file  . Stress: Not on file  Relationships  . Social connections:    Talks on phone: Not on file    Gets together: Not on file    Attends religious service: Not on file    Active member of club or organization: Not on file    Attends meetings of clubs or organizations: Not on file    Relationship status: Not on file  . Intimate partner violence:    Fear of current or ex partner: Not on file    Emotionally abused: Not on file    Physically  abused: Not on file    Forced sexual activity: Not on file  Other Topics Concern  . Not on file  Social History Narrative  . Not on file    Allergies:  No Known Allergies  Medications: Prior to Admission medications   Not on File    Physical Exam Vitals: Blood pressure 114/70, height 5\' 1"  (1.549 m), weight 138 lb (62.6 kg), last menstrual period 07/10/2018.  General: NAD HEENT: normocephalic, anicteric Thyroid: no enlargement, no palpable nodules Pulmonary: No increased work of breathing, CTAB Cardiovascular: RRR, distal pulses 2+ Breast: Breast symmetrical, no tenderness, no palpable nodules or masses, no skin or nipple retraction present, no nipple discharge.  No axillary or supraclavicular lymphadenopathy. Abdomen: NABS, soft, non-tender, non-distended.  Umbilicus without lesions.  No hepatomegaly, splenomegaly or masses palpable. No evidence of hernia  Genitourinary:  External: Normal external female genitalia.  Normal urethral meatus, normal Bartholin's and Skene's glands.    Vagina: Normal vaginal mucosa, no evidence of prolapse.    Cervix: Grossly normal in appearance, increased vascularity, no bleeding  Uterus: deferred for no concerns  Adnexa: deferred for no concerns  Rectal: deferred  Anus: 3 mm circular raised mole, black, non-tender to palpation  Lymphatic: no evidence of inguinal lymphadenopathy Extremities: no edema, erythema, or tenderness Neurologic: Grossly intact Psychiatric: mood appropriate, affect full    Assessment: 28 y.o. G1P1001 routine annual exam  Plan: Problem List Items Addressed This Visit    None    Visit Diagnoses    Well woman exam with routine gynecological exam    -  Primary   Relevant Orders   Cytology - PAP   STD Panel   Screen for sexually transmitted diseases       Relevant Orders   Cytology - PAP   STD Panel   Cervical cancer screening       Relevant Orders   Cytology - PAP      1) STI screening  was offered and  accepted  2)  ASCCP guidelines and rational discussed.  Patient opts for every 3 years screening interval  3) Contraception - the patient is currently using  condoms.  She is happy with her current form of contraception and plans to continue. She and her husband are still considering vasectomy  4) Routine healthcare maintenance including cholesterol, diabetes screening discussed Ordered today   5) See dermatologist for any worsening of mole  6) Return in 1 year (on 08/15/2019).   Rod Can, [facility], [facility] 08/14/2018, 9:46 AM

## 2018-08-14 NOTE — Patient Instructions (Signed)

## 2018-08-16 DIAGNOSIS — Z1371 Encounter for nonprocreative screening for genetic disease carrier status: Secondary | ICD-10-CM

## 2018-08-16 DIAGNOSIS — Z803 Family history of malignant neoplasm of breast: Secondary | ICD-10-CM

## 2018-08-16 HISTORY — DX: Encounter for nonprocreative screening for genetic disease carrier status: Z13.71

## 2018-08-16 HISTORY — DX: Family history of malignant neoplasm of breast: Z80.3

## 2018-08-18 LAB — ESTRADIOL: ESTRADIOL: 90.1 pg/mL

## 2018-08-18 LAB — RPR+HSVIGM+HBSAG+HSV2(IGG)+...
HIV SCREEN 4TH GENERATION: NONREACTIVE
HSV 2 IgG, Type Spec: <0.91 index (ref 0.00–0.90)
HSVI/II Comb IgM: <0.91 Ratio (ref 0.00–0.90)
Hepatitis B Surface Ag: NEGATIVE
RPR: NONREACTIVE

## 2018-08-18 LAB — COMPREHENSIVE METABOLIC PANEL
A/G RATIO: 2.3 — AB (ref 1.2–2.2)
ALK PHOS: 39 IU/L (ref 39–117)
ALT: 16 IU/L (ref 0–32)
AST: 18 IU/L (ref 0–40)
Albumin: 4.8 g/dL (ref 3.9–5.0)
BILIRUBIN TOTAL: 0.4 mg/dL (ref 0.0–1.2)
BUN/Creatinine Ratio: 20 (ref 9–23)
BUN: 15 mg/dL (ref 6–20)
CHLORIDE: 103 mmol/L (ref 96–106)
CO2: 22 mmol/L (ref 20–29)
Calcium: 9 mg/dL (ref 8.7–10.2)
Creatinine, Ser: 0.76 mg/dL (ref 0.57–1.00)
GFR calc non Af Amer: 108 mL/min/{1.73_m2} (ref >59)
GFR, EST AFRICAN AMERICAN: 124 mL/min/{1.73_m2} (ref >59)
Globulin, Total: 2.1 g/dL (ref 1.5–4.5)
Glucose: 73 mg/dL (ref 65–99)
POTASSIUM: 4.9 mmol/L (ref 3.5–5.2)
Sodium: 139 mmol/L (ref 134–144)
Total Protein: 6.9 g/dL (ref 6.0–8.5)

## 2018-08-18 LAB — CBC
Hematocrit: 40.8 % (ref 34.0–46.6)
Hemoglobin: 14.3 g/dL (ref 11.1–15.9)
MCH: 31.7 pg (ref 26.6–33.0)
MCHC: 35 g/dL (ref 31.5–35.7)
MCV: 91 fL (ref 79–97)
PLATELETS: 260 10*3/uL (ref 150–450)
RBC: 4.51 x10E6/uL (ref 3.77–5.28)
RDW: 12.2 % (ref 11.7–15.4)
WBC: 5.1 10*3/uL (ref 3.4–10.8)

## 2018-08-18 LAB — LIPID PANEL WITH LDL/HDL RATIO
Cholesterol, Total: 176 mg/dL (ref 100–199)
HDL: 51 mg/dL (ref >39)
LDL Calculated: 117 mg/dL — ABNORMAL HIGH (ref 0–99)
LDl/HDL Ratio: 2.3 ratio (ref 0.0–3.2)
TRIGLYCERIDES: 39 mg/dL (ref 0–149)
VLDL CHOLESTEROL CAL: 8 mg/dL (ref 5–40)

## 2018-08-18 LAB — CYTOLOGY - PAP
Diagnosis: NEGATIVE
HPV: NOT DETECTED

## 2018-08-18 LAB — PROGESTERONE: Progesterone: 8.5 ng/mL

## 2018-08-18 LAB — TESTOSTERONE: Testosterone: 26 ng/dL (ref 8–48)

## 2018-08-26 ENCOUNTER — Encounter: Payer: Self-pay | Admitting: Obstetrics and Gynecology

## 2019-01-05 NOTE — Telephone Encounter (Signed)
Opal Sidles reached out to me since I handle [facility], BUT [facility] has nothing to do with [facility]. Pt can call ins to check where this bill is coming from???

## 2019-04-14 ENCOUNTER — Encounter: Payer: Self-pay | Admitting: Maternal Newborn

## 2019-04-14 ENCOUNTER — Other Ambulatory Visit (HOSPITAL_COMMUNITY)
Admission: RE | Admit: 2019-04-14 | Discharge: 2019-04-14 | Disposition: A | Discharge status: Home / Self Care | Payer: 59 | Source: Ambulatory Visit | Attending: Maternal Newborn | Admitting: Maternal Newborn

## 2019-04-14 ENCOUNTER — Ambulatory Visit (INDEPENDENT_AMBULATORY_CARE_PROVIDER_SITE_OTHER): Payer: 59 | Admitting: Maternal Newborn

## 2019-04-14 ENCOUNTER — Other Ambulatory Visit: Payer: Self-pay

## 2019-04-14 VITALS — BP 90/60 | Wt 159.0 lb

## 2019-04-14 DIAGNOSIS — Z3480 Encounter for supervision of other normal pregnancy, unspecified trimester: Secondary | ICD-10-CM | POA: Insufficient documentation

## 2019-04-14 DIAGNOSIS — Z369 Encounter for antenatal screening, unspecified: Secondary | ICD-10-CM

## 2019-04-14 DIAGNOSIS — Z3A01 Less than 8 weeks gestation of pregnancy: Secondary | ICD-10-CM

## 2019-04-14 DIAGNOSIS — N912 Amenorrhea, unspecified: Secondary | ICD-10-CM

## 2019-04-14 DIAGNOSIS — O0990 Supervision of high risk pregnancy, unspecified, unspecified trimester: Secondary | ICD-10-CM | POA: Insufficient documentation

## 2019-04-14 DIAGNOSIS — O09291 Supervision of pregnancy with other poor reproductive or obstetric history, first trimester: Secondary | ICD-10-CM

## 2019-04-14 LAB — POCT URINALYSIS DIPSTICK OB
Glucose, UA: NEGATIVE
POC,PROTEIN,UA: NEGATIVE

## 2019-04-14 NOTE — Progress Notes (Signed)
04/14/2019   Chief Complaint: Desires prenatal care.  Transfer of Care Patient: No  History of Present Illness: Marie Stephenson is a 28 y.o. G2P1001 at 40w5dbased on Patient's last menstrual period on 02/26/2019 (exact date), with an Estimated Date of Delivery: 12/03/2019, with the above CC.   Her periods were: regular periods every 28-29 days, lasting 4-5 days.   She has Positive signs or symptoms of nausea of pregnancy. She has Negative signs or symptoms of miscarriage or preterm labor She identifies Negative Zika risk factors for her and her partner On any different medications around the time she conceived/early pregnancy: No  History of varicella: Yes   Review of Systems  Constitutional: Positive for malaise/fatigue.       Change in appetite  HENT: Negative.   Eyes: Negative.   Respiratory: Negative for shortness of breath and wheezing.   Cardiovascular: Negative for chest pain and palpitations.  Gastrointestinal: Positive for nausea. Negative for vomiting.  Genitourinary: Negative.   Musculoskeletal: Negative.   Skin: Negative.   Neurological: Negative.   Endo/Heme/Allergies: Negative.   Psychiatric/Behavioral: Negative.   Breasts: Positive for breast tenderness.  Review of systems was otherwise negative, except as stated in the above HPI.  OBGYN History: As per HPI. OB History  Gravida Para Term Preterm AB Living  2 1 1     1   SAB TAB Ectopic Multiple Live Births          1    # Outcome Date GA Lbr Len/2nd Weight Sex Delivery Anes PTL Lv  2 Current           1 Term     M Vag-Spont   LIV    Obstetric Comments  Menstrual age: [age]   Age 1st Pregnancy: 89   Any issues with any prior pregnancies: yes, pre-eclampsia with G1 Any prior children are healthy, doing well, without any problems or issues: yes History of pap smears: Yes. Last pap smear 08/14/2018. NILM/HPV Negative History of STIs: None except for past positive HPV.   Past Medical History: Past Medical  History:  Diagnosis Date  . BRCA negative 08/2018   MyRisk neg except CDK4 VUS  . Family history of breast cancer 08/2018   IBIS=11%/riskscore=8.4%  . Heart murmur    since birth  . Human papilloma virus     Past Surgical History: Past Surgical History:  Procedure Laterality Date  . WISDOM TOOTH EXTRACTION      Family History:  Family History  Problem Relation Age of Onset  . Endometriosis Mother   . Hypertension Mother   . Breast cancer Maternal Grandmother 365      great grandmother  . Dementia Maternal Grandmother   . Leukemia Maternal Grandfather   . Hypertension Maternal Grandfather    She does not have a history of any female cancers, bleeding or blood clotting disorders.  There is no history of intellectual disability, birth defects or genetic disorders in her or the FOB's history.  Social History:  Social History   Socioeconomic History  . Marital status: Married    Spouse name: Not on file  . Number of children: 1  . Years of education: 141 . Highest education level: Not on file  Occupational History  . 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 Financial resource strain: Not on file  . Food insecurity    Worry: Not on file    Inability: Not on  file  . Transportation needs    Medical: Not on file    Non-medical: Not on file  Tobacco Use  . Smoking status: Never Smoker  . Smokeless tobacco: Never Used  Substance and Sexual Activity  . Alcohol use: Not Currently    Alcohol/week: 0.0 standard drinks  . Drug use: No  . Sexual activity: Yes    Birth control/protection: None  Lifestyle  . Physical activity    Days per week: Not on file    Minutes per session: Not on file  . Stress: Not on file  Relationships  . Social Herbalist on phone: Not on file    Gets together: Not on file    Attends religious service: Not on file    Active member of club or organization: Not on file    Attends meetings of  clubs or organizations: Not on file    Relationship status: Not on file  . Intimate partner violence    Fear of current or ex partner: Not on file    Emotionally abused: Not on file    Physically abused: Not on file    Forced sexual activity: Not on file  Other Topics Concern  . Not on file  Social History Narrative  . Not on file   Any cats in the household: yes, aware not to be in contact with cat litter/feces to avoid risk of toxoplasmosis. Domestic violence screening is negative.  Allergy: No Known Allergies  Current Outpatient Medications: No current outpatient medications on file.   Physical Exam:   BP 90/60   Wt 159 lb (72.1 kg)   LMP 02/26/2019 (Exact Date)   BMI 30.04 kg/m  Body mass index is 30.04 kg/m. Constitutional: Well nourished, well developed female in no acute distress.  Neck:  Supple, normal appearance, and no thyromegaly  Cardiovascular: S1, S2 normal, no murmur, rub or gallop, regular rate and rhythm Respiratory:  Clear to auscultation bilaterally. Normal respiratory effort Abdomen: No masses, hernias; diffusely non tender to palpation, non distended Breasts: breasts appear normal, no suspicious masses, no skin or nipple changes or axillary nodes. Neuro/Psych:  Normal mood and affect.  Skin:  Warm and dry.  Lymphatic:  No inguinal lymphadenopathy.   Pelvic exam: is not limited by body habitus External genitalia, Bartholin's glands, Urethra, Skene's glands: within normal limits Vagina: within normal limits and with no blood in the vault  Cervix: not visualized, closed/long/high, no CMT Uterus:  enlarged, normal contour Adnexa:  no mass, fullness; mild tenderness on left side  Assessment: Marie Stephenson is a 82 y.o. G2P1001 at 72w5dbased on Patient's last menstrual period on 02/26/2019 (exact date), with an Estimated Date of Delivery: 12/03/2019, presenting for prenatal care.  Plan:  1) Avoid alcoholic beverages. 2) Patient encouraged not to smoke.   3) Discontinue the use of all non-medicinal drugs and chemicals.  4) Take prenatal vitamins daily.  5) Seatbelt use advised 6) Nutrition, food safety, and exercise discussed. 7) [facility]  8) Patient is asked about travel to areas at risk for the ZAmesvirus, and counseled to avoid travel and exposure to mosquitoes or  partners who may have themselves been exposed to the virus.  10) Genetic Screening, such as with 1st Trimester Screening, cell free fetal DNA, AFP testing, and Ultrasound, is discussed with patient. She plans to decline genetic testing this pregnancy. 11) NOB labs today, dating scan ordered.  Problem list reviewed and updated.  Avel Sensor, CNM [facility], [facility] 04/14/2019  9:45 AM

## 2019-04-14 NOTE — Progress Notes (Signed)
No problems.rj 

## 2019-04-15 ENCOUNTER — Encounter: Payer: Self-pay | Admitting: Maternal Newborn

## 2019-04-15 DIAGNOSIS — O09299 Supervision of pregnancy with other poor reproductive or obstetric history, unspecified trimester: Secondary | ICD-10-CM | POA: Insufficient documentation

## 2019-04-15 LAB — RPR+RH+ABO+RUB AB+AB SCR+CB...
Antibody Screen: NEGATIVE
HIV Screen 4th Generation wRfx: NONREACTIVE
Hematocrit: 40.9 % (ref 34.0–46.6)
Hemoglobin: 14 g/dL (ref 11.1–15.9)
Hepatitis B Surface Ag: NEGATIVE
MCH: 30.4 pg (ref 26.6–33.0)
MCHC: 34.2 g/dL (ref 31.5–35.7)
MCV: 89 fL (ref 79–97)
Platelets: 234 10*3/uL (ref 150–450)
RBC: 4.61 x10E6/uL (ref 3.77–5.28)
RDW: 11.8 % (ref 11.7–15.4)
RPR Ser Ql: NONREACTIVE
Rh Factor: NEGATIVE
Rubella Antibodies, IGG: 3.42 index (ref >0.99)
Varicella zoster IgG: 929 index (ref >165)
WBC: 6.2 10*3/uL (ref 3.4–10.8)

## 2019-04-15 LAB — URINE DRUG PANEL 7
Amphetamines, Urine: NEGATIVE ng/mL
Barbiturate Quant, Ur: NEGATIVE ng/mL
Benzodiazepine Quant, Ur: NEGATIVE ng/mL
Cannabinoid Quant, Ur: NEGATIVE ng/mL
Cocaine (Metab.): NEGATIVE ng/mL
Opiate Quant, Ur: NEGATIVE ng/mL
PCP Quant, Ur: NEGATIVE ng/mL

## 2019-04-16 LAB — CERVICOVAGINAL ANCILLARY ONLY
Chlamydia: NEGATIVE
Comment: NEGATIVE
Comment: NORMAL
Neisseria Gonorrhea: NEGATIVE

## 2019-04-16 LAB — URINE CULTURE: Organism ID, Bacteria: NO GROWTH

## 2019-04-21 ENCOUNTER — Ambulatory Visit (INDEPENDENT_AMBULATORY_CARE_PROVIDER_SITE_OTHER): Payer: 59 | Admitting: Certified Nurse Midwife

## 2019-04-21 ENCOUNTER — Ambulatory Visit (INDEPENDENT_AMBULATORY_CARE_PROVIDER_SITE_OTHER): Payer: 59

## 2019-04-21 ENCOUNTER — Other Ambulatory Visit: Payer: Self-pay

## 2019-04-21 ENCOUNTER — Encounter: Payer: Self-pay | Admitting: Certified Nurse Midwife

## 2019-04-21 VITALS — BP 110/60 | Wt 147.0 lb

## 2019-04-21 DIAGNOSIS — Z3A01 Less than 8 weeks gestation of pregnancy: Secondary | ICD-10-CM

## 2019-04-21 DIAGNOSIS — O0990 Supervision of high risk pregnancy, unspecified, unspecified trimester: Secondary | ICD-10-CM

## 2019-04-21 DIAGNOSIS — O30041 Twin pregnancy, dichorionic/diamniotic, first trimester: Secondary | ICD-10-CM

## 2019-04-21 DIAGNOSIS — O30049 Twin pregnancy, dichorionic/diamniotic, unspecified trimester: Secondary | ICD-10-CM

## 2019-04-21 DIAGNOSIS — Z369 Encounter for antenatal screening, unspecified: Secondary | ICD-10-CM

## 2019-04-21 DIAGNOSIS — N8311 Corpus luteum cyst of right ovary: Secondary | ICD-10-CM

## 2019-04-21 DIAGNOSIS — Z6791 Unspecified blood type, Rh negative: Secondary | ICD-10-CM

## 2019-04-21 DIAGNOSIS — O3481 Maternal care for other abnormalities of pelvic organs, first trimester: Secondary | ICD-10-CM | POA: Diagnosis not present

## 2019-04-21 DIAGNOSIS — O0991 Supervision of high risk pregnancy, unspecified, first trimester: Secondary | ICD-10-CM

## 2019-04-21 DIAGNOSIS — O26891 Other specified pregnancy related conditions, first trimester: Secondary | ICD-10-CM

## 2019-04-21 LAB — POCT URINALYSIS DIPSTICK OB
Glucose, UA: NEGATIVE
POC,PROTEIN,UA: NEGATIVE

## 2019-04-21 MED ORDER — FOLIC ACID 1 MG PO TABS: 1.0000 mg | ORAL_TABLET | Freq: Every day | ORAL | 3 refills | Status: DC

## 2019-04-21 NOTE — Progress Notes (Signed)
C/O TWINS!!  What to expect as far as 'high risk' goes.rj

## 2019-05-09 ENCOUNTER — Encounter: Payer: Self-pay | Admitting: Certified Nurse Midwife

## 2019-05-09 DIAGNOSIS — O26899 Other specified pregnancy related conditions, unspecified trimester: Secondary | ICD-10-CM | POA: Insufficient documentation

## 2019-05-09 DIAGNOSIS — O30049 Twin pregnancy, dichorionic/diamniotic, unspecified trimester: Secondary | ICD-10-CM | POA: Insufficient documentation

## 2019-05-09 DIAGNOSIS — Z6791 Unspecified blood type, Rh negative: Secondary | ICD-10-CM | POA: Insufficient documentation

## 2019-05-09 NOTE — Progress Notes (Signed)
ROB at 7wk5d: Has twins on dating scan today. They appear to be dichorionic/ diamniotic with  Twin A measuring 7wk1d and FCA 142 Twin B measuring 7wk2d and FCA 148  Discussed twin gestation and risks associated with carring a twin gestation. Also discussed increased surveillance with growth scans  and antepartum testing Given ACOG pamphlet on twins Add extra folic acid 1 mgm to prenatal vitamins Desires cell free DNA testing when she returns in 4 weeks  Dalia Heading, CNM

## 2019-05-19 ENCOUNTER — Other Ambulatory Visit: Payer: Self-pay

## 2019-05-19 ENCOUNTER — Encounter: Payer: Self-pay | Admitting: Obstetrics & Gynecology

## 2019-05-19 ENCOUNTER — Ambulatory Visit (INDEPENDENT_AMBULATORY_CARE_PROVIDER_SITE_OTHER): Payer: 59 | Admitting: Obstetrics & Gynecology

## 2019-05-19 VITALS — BP 100/70 | Wt 151.0 lb

## 2019-05-19 DIAGNOSIS — O30041 Twin pregnancy, dichorionic/diamniotic, first trimester: Secondary | ICD-10-CM

## 2019-05-19 DIAGNOSIS — O30049 Twin pregnancy, dichorionic/diamniotic, unspecified trimester: Secondary | ICD-10-CM

## 2019-05-19 DIAGNOSIS — Z3A11 11 weeks gestation of pregnancy: Secondary | ICD-10-CM

## 2019-05-19 DIAGNOSIS — O0990 Supervision of high risk pregnancy, unspecified, unspecified trimester: Secondary | ICD-10-CM

## 2019-05-19 DIAGNOSIS — O26891 Other specified pregnancy related conditions, first trimester: Secondary | ICD-10-CM

## 2019-05-19 DIAGNOSIS — Z6791 Unspecified blood type, Rh negative: Secondary | ICD-10-CM

## 2019-05-19 DIAGNOSIS — O0991 Supervision of high risk pregnancy, unspecified, first trimester: Secondary | ICD-10-CM

## 2019-05-19 NOTE — Progress Notes (Signed)
  Subjective  Fetal Movement? no Min nausea  Objective  BP 100/70   Wt 151 lb (68.5 kg)   LMP 02/26/2019 (Exact Date)   BMI 28.53 kg/m  General: NAD Pumonary: no increased work of breathing Abdomen: gravid, non-tender Extremities: no edema Psychiatric: mood appropriate, affect full  Assessment  28 y.o. G2P1001 at [redacted]w[redacted]d by  12/03/2019, by Last Menstrual Period presenting for routine prenatal visit  Plan   Problem List Items Addressed This Visit      Other   Supervision of high risk pregnancy, antepartum   Dichorionic diamniotic twin pregnancy, antepartum   Rh negative state in antepartum period    Other Visit Diagnoses    [redacted] weeks gestation of pregnancy    -  Primary      SECOND Problems (from 04/14/19 to present)    Problem Noted Resolved   History of pre-eclampsia in prior pregnancy, currently pregnant 04/15/2019 by Rexene Agent, CNM No   Supervision of high risk pregnancy, antepartum 04/14/2019 by Rexene Agent, CNM No   Overview Addendum 05/19/2019  9:27 AM by Gae Dry, MD    Clinic [facility] Prenatal Labs  Dating LMP, [place] Blood type: O/Negative/-- (10/28 1022)   Genetic Screen NIPS: Antibody:Negative (10/28 1022)  Anatomic [place]  Rubella: 3.42 (10/28 1022) Varicella:  Immune  GTT    Third trimester:  RPR: Non Reactive (10/28 1022)   Rhogam Needs HBsAg: Negative (10/28 1022)   TDaP vaccine                   Flu Shot: HIV: Non Reactive (10/28 1022)   Baby Food                                GBS:   Contraception  Pap: 08/14/2018, NILM/HPV Negative  CBB  no   CS/VBAC no   Support Person                Pan[facility]ama today per pt request as advertises it reveals zygosity and identical vs fraternal twin status  Barnett Applebaum, MD, [place], [facility] 05/19/2019  9:46 AM

## 2019-05-19 NOTE — Patient Instructions (Signed)
Genetic Testing During Pregnancy Genetic testing during pregnancy is also called prenatal genetic testing. This type of testing can determine if your baby is at risk of being born with a disorder caused by abnormal genes or chromosomes (genetic disorder). Chromosomes contain genes that control how your baby will develop in your womb. There are many different genetic disorders. Examples of genetic disorders that may be found through genetic testing include Down syndrome and cystic fibrosis. Gene changes (mutations) can be passed down through families. Genetic testing is offered to all women before or during pregnancy. You can choose whether to have genetic testing. Why is genetic testing done? Genetic testing is done during pregnancy to find out whether your child is at risk for a genetic disorder. Having genetic testing allows you to:  Discuss your test results and options with a genetic counselor.  Prepare for a baby that may be born with a genetic disorder. Learning about the disorder ahead of time helps you be better prepared to manage it. Your health care providers can also be prepared in case your baby requires special care before or after birth.  Consider whether you want to continue with the pregnancy. In some cases, genetic testing may be done to learn about the traits a child will inherit. Types of genetic tests There are two basic types of genetic testing. Screening tests indicate whether your developing baby (fetus) is at higher risk for a genetic disorder. Diagnostic tests check actual fetal cells to diagnose a genetic disorder. Screening tests     Screening tests will not harm your baby. They are recommended for all pregnant women. Types of screening tests include:  Carrier screening. This test involves checking genes from both parents by testing their blood or saliva. The test checks to find out if the parents carry a genetic mutation that may be passed to a baby. In most cases,  both parents must carry the mutation for a baby to be at risk.  First trimester screening. This test combines a blood test with sound wave imaging of your baby (fetal ultrasound). This screening test checks for a risk of Down syndrome or other defects caused by having extra chromosomes. It also checks for defects of the heart, abdomen, or skeleton.  Second trimester screening also combines a blood test with a fetal ultrasound exam. It checks for a risk of genetic defects of the face, brain, spine, heart, or limbs.  Combined or sequential screening. This type of testing combines the results of first and second trimester screening. This type of testing may be more accurate than first or second trimester screening alone.  Cell-free DNA testing. This is a blood test that detects cells released by the placenta that get into the mother's blood. It can be used to check for a risk of Down syndrome, other extra chromosome syndromes, and disorders caused by abnormal numbers of sex chromosomes. This test can be done any time after 10 weeks of pregnancy.  Diagnostic tests Diagnostic tests carry slight risks of problems, including bleeding, infection, and loss of the pregnancy. These tests are done only if your baby is at risk for a genetic disorder. You may meet with a genetic counselor to discuss the risks and benefits before having diagnostic tests. Examples of diagnostic tests include:  Chorionic villus sampling (CVS). This involves a procedure to remove and test a sample of cells taken from the placenta. The procedure may be done between 10 and 12 weeks of pregnancy.  Amniocentesis. This involves a  procedure to remove and test a sample of fluid (amniotic fluid) and cells from the sac that surrounds the developing baby. The procedure may be done between 15 and 20 weeks of pregnancy. What do the results mean? For a screening test:  If the results are negative, it often means that your child is not at higher  risk. There is still a slight chance your child could have a genetic disorder.  If the results are positive, it does not mean your child will have a genetic disorder. It may mean that your child has a higher-than-normal risk for a genetic disorder. In that case, you may want to talk with a genetic counselor about whether you should have diagnostic genetic tests. For a diagnostic test:  If the result is negative, it is unlikely that your child will have a genetic disorder.  If the test is positive for a genetic disorder, it is likely that your child will have the disorder. The test may not tell how severe the disorder will be. Talk with your health care provider about your options. Questions to ask your health care provider Before talking to your health care provider about genetic testing, find out if there is a history of genetic disorders in your family. It may also help to know your family's ethnic origins. Then ask your health care provider the following questions:  Is my baby at risk for a genetic disorder?  What are the benefits of having genetic screening?  What tests are best for me and my baby?  What are the risks of each test?  If I get a positive result on a screening test, what is the next step?  Should I meet with a genetic counselor before having a diagnostic test?  Should my partner or other members of my family be tested?  How much do the tests cost? Will my insurance cover the testing? Summary  Genetic testing is done during pregnancy to find out whether your child is at risk for a genetic disorder.  Genetic testing is offered to all women before or during pregnancy. You can choose whether to have genetic testing.  There are two basic types of genetic testing. Screening tests indicate whether your developing baby (fetus) is at higher risk for a genetic disorder. Diagnostic tests check actual fetal cells to diagnose a genetic disorder.  If a diagnostic genetic test is  positive, talk with your health care provider about your options. This information is not intended to replace advice given to you by your health care provider. Make sure you discuss any questions you have with your health care provider. Document Released: 08/18/2017 Document Revised: 09/24/2018 Document Reviewed: 08/18/2017 Elsevier Patient Education  2020 [employer].  Multiple Pregnancy Having a multiple pregnancy means that a woman is carrying more than one baby at a time. She may be pregnant with twins, triplets, or more. The majority of multiple pregnancies are twins. Naturally conceiving triplets or more (higher-order multiples) is rare. Multiple pregnancies are riskier than single pregnancies. A woman with a multiple pregnancy is more likely to have certain problems during her pregnancy. Therefore, she will need to have more frequent appointments for prenatal care. How does a multiple pregnancy happen? A multiple pregnancy happens when:  The woman's body releases more than one egg at a time, and then each egg gets fertilized by a different sperm. ? This is the most common type of multiple pregnancy. ? Twins or other multiples produced this way are fraternal. They are  no more alike than non-multiple siblings are.  One sperm fertilizes one egg, which then divides into more than one embryo. ? Twins or other multiples produced this way are identical. Identical multiples are always the same gender, and they look very much alike. Who is most likely to have a multiple pregnancy? A multiple pregnancy is more likely to develop in women who:  Have had fertility treatment, especially if the treatment included fertility drugs.  Are older than 28 years of age.  Have already had four or more children.  Have a family history of multiple pregnancy. How is a multiple pregnancy diagnosed? A multiple pregnancy may be diagnosed based on:  Symptoms such as: ? Rapid weight gain in the first 3  months of pregnancy (first trimester). ? More severe nausea and breast tenderness than what is typical of a single pregnancy. ? The uterus measuring larger than what is normal for the stage of the pregnancy.  Blood tests that detect a higher-than-normal level of human chorionic gonadotropin (hCG). This is a hormone that your body produces in early pregnancy.  Ultrasound exam. This is used to confirm that you are carrying multiples. What risks are associated with multiple pregnancy? A multiple pregnancy puts you at a higher risk for certain problems during or after your pregnancy, including:  Having your babies delivered before you have reached a full-term pregnancy (preterm birth). A full-term pregnancy lasts for at least 37 weeks. Babies born before 23 weeks may have a higher risk of a variety of health problems, such as breathing problems, feeding difficulties, cerebral palsy, and learning disabilities.  Diabetes.  Preeclampsia. This is a serious condition that causes high blood pressure along with other symptoms, such as swelling and headaches, during pregnancy.  Excessive blood loss after childbirth (postpartum hemorrhage).  Postpartum depression.  Low birth weight of the babies. How will having a multiple pregnancy affect my care? Your health care provider will want to monitor you more closely during your pregnancy to make sure that your babies are growing normally and that you are healthy. Follow these instructions at home: Because your pregnancy is considered to be high risk, you will need to work closely with your health care team. You may also need to make some lifestyle changes. These may include the following: Eating and drinking  Increase your nutrition. ? Follow your health care provider's recommendations for weight gain. You may need to gain a little extra weight when you are pregnant with multiples. ? Eat healthy snacks often throughout the day. This can add calories and  reduce nausea.  Drink enough fluid to keep your urine pale yellow.  Take prenatal vitamins. Activity By 20-24 weeks, you may need to limit your activities.  Avoid activities and work that take a lot of effort (are strenuous).  Ask your health care provider when you should stop having sexual intercourse.  Rest often. General instructions  Do not use any products that contain nicotine or tobacco, such as cigarettes and e-cigarettes. If you need help quitting, ask your health care provider.  Do not drink alcohol or use illegal drugs.  Take over-the-counter and prescription medicines only as told by your health care provider.  Arrange for extra help around the house.  Keep all follow-up visits and all prenatal visits as told by your health care provider. This is important. Contact a health care provider if:  You have dizziness.  You have persistent nausea, vomiting, or diarrhea.  You are having trouble gaining weight.  You  have feelings of depression or other emotions that are interfering with your normal activities. Get help right away if:  You have a fever.  You have pain with urination.  You have fluid leaking from your vagina.  You have a bad-smelling vaginal discharge.  You notice increased swelling in your face, hands, legs, or ankles.  You have spotting or bleeding from your vagina.  You have pelvic cramps, pelvic pressure, or nagging pain in your abdomen or lower back.  You are having regular contractions.  You develop a severe headache, with or without visual changes.  You have shortness of breath or chest pain.  You notice less fetal movement, or no fetal movement. Summary  Having a multiple pregnancy means that a woman is carrying more than one baby at a time.  A multiple pregnancy puts you at a higher risk for certain problems during and after your pregnancy, such as: having your babies delivered before you have reached a full-term pregnancy  (preterm birth), diabetes, preeclampsia, excessive blood loss after childbirth (postpartum hemorrhage), postpartum depression, or low birth weight of the babies.  Your health care provider will want to monitor you more closely during your pregnancy to make sure that your babies are growing normally and that you are healthy.  You may need to make some lifestyle changes during pregnancy, including: increasing your nutrition, limiting your activities after 20-24 weeks of pregnancy, and arranging for extra help around the house. This information is not intended to replace advice given to you by your health care provider. Make sure you discuss any questions you have with your health care provider. Document Released: 03/12/2008 Document Revised: 09/24/2018 Document Reviewed: 02/02/2016 Elsevier Patient Education  2020 [employer].

## 2019-05-27 ENCOUNTER — Telehealth: Payer: Self-pay | Admitting: Obstetrics & Gynecology

## 2019-05-27 NOTE — Telephone Encounter (Signed)
Need to know if [facility] needed the Horizon test also

## 2019-05-27 NOTE — Telephone Encounter (Signed)
Mendel Ryder, the [employer] rep, has called to follow up as well. If any questions, feel free to call her at [phone].

## 2019-05-27 NOTE — Telephone Encounter (Signed)
[employer] calling regarding patient genetic testing, need to clarify test order.  Case [id number].  CB [phone]

## 2019-05-28 ENCOUNTER — Other Ambulatory Visit: Payer: Self-pay | Admitting: Obstetrics & Gynecology

## 2019-05-28 NOTE — Telephone Encounter (Signed)
PLEASE ADVISE.  REF # [id number]

## 2019-05-28 NOTE — Telephone Encounter (Signed)
Samples sent in error, [facility] did not order the horizon,

## 2019-06-14 ENCOUNTER — Other Ambulatory Visit: Payer: Self-pay

## 2019-06-14 ENCOUNTER — Encounter: Payer: Self-pay | Admitting: Obstetrics & Gynecology

## 2019-06-14 ENCOUNTER — Ambulatory Visit (INDEPENDENT_AMBULATORY_CARE_PROVIDER_SITE_OTHER): Payer: 59 | Admitting: Obstetrics & Gynecology

## 2019-06-14 VITALS — BP 120/80 | Wt 157.0 lb

## 2019-06-14 DIAGNOSIS — Z3A15 15 weeks gestation of pregnancy: Secondary | ICD-10-CM

## 2019-06-14 DIAGNOSIS — O30049 Twin pregnancy, dichorionic/diamniotic, unspecified trimester: Secondary | ICD-10-CM

## 2019-06-14 DIAGNOSIS — O0992 Supervision of high risk pregnancy, unspecified, second trimester: Secondary | ICD-10-CM

## 2019-06-14 DIAGNOSIS — O26892 Other specified pregnancy related conditions, second trimester: Secondary | ICD-10-CM

## 2019-06-14 DIAGNOSIS — Z3689 Encounter for other specified antenatal screening: Secondary | ICD-10-CM

## 2019-06-14 DIAGNOSIS — O30042 Twin pregnancy, dichorionic/diamniotic, second trimester: Secondary | ICD-10-CM

## 2019-06-14 DIAGNOSIS — O0990 Supervision of high risk pregnancy, unspecified, unspecified trimester: Secondary | ICD-10-CM

## 2019-06-14 DIAGNOSIS — Z6791 Unspecified blood type, Rh negative: Secondary | ICD-10-CM

## 2019-06-14 NOTE — Progress Notes (Signed)
  Subjective  Fetal Movement? yes Nausea no Pt reports some occas crampy lower abdominal pains this weekend  Objective  BP 120/80   Wt 157 lb (71.2 kg)   LMP 02/26/2019 (Exact Date)   BMI 29.66 kg/m  General: NAD Pumonary: no increased work of breathing Abdomen: gravid, non-tender Extremities: no edema Psychiatric: mood appropriate, affect full  Assessment  28 y.o. G2P1001 at [redacted]w[redacted]d by  12/03/2019, by Last Menstrual Period presenting for routine prenatal visit  Plan   Problem List Items Addressed This Visit      Other   Supervision of high risk pregnancy, antepartum   Dichorionic diamniotic twin pregnancy, antepartum   Rh negative state in antepartum period    Other Visit Diagnoses    [redacted] weeks gestation of pregnancy    -  Primary   Screening, antenatal, for fetal anatomic survey       Relevant Orders   US OB Comp + 14 Wk   US OB Detail Twin + 14 Wk      SECOND Problems (from 04/14/19 to present)    Problem Noted Resolved   History of pre-eclampsia in prior pregnancy, currently pregnant 04/15/2019 by Rexene Agent, CNM No   Supervision of high risk pregnancy, antepartum 04/14/2019 by Rexene Agent, CNM No   Overview Addendum 06/14/2019  9:02 AM by Gae Dry, MD    [facility] Prenatal Labs  Dating LMP, [place] Blood type: O/Negative/-- (10/28 1022)   Genetic Screen NIPS: Antibody:Negative (10/28 1022)  Anatomic [place]  Rubella: 3.42 (10/28 1022) Varicella:  Immune  GTT    Third trimester:  RPR: Non Reactive (10/28 1022)   Rhogam Needs HBsAg: Negative (10/28 1022)   TDaP vaccine          Flu Shot:declines HIV: Non Reactive (10/28 1022)   Baby Food                                GBS:   Contraception  Pap: 08/14/2018, NILM/HPV Negative  CBB  no   CS/VBAC no   Support Person                 [place] NEG today Counseled to monitor pains, seek appt if worsens or bleeding [place] nv   Barnett Applebaum, MD, [place], [facility] 06/14/2019   9:17 AM

## 2019-06-14 NOTE — Patient Instructions (Signed)
Prenatal Ultrasound A prenatal ultrasound exam, also called a sonogram, is an imaging test that allows your health care provider to see your baby and placenta in the uterus. This is a safe and painless test that does not expose you or your baby to any X-rays, needles, or medicines. Prenatal ultrasounds are done using a handheld plastic device (transducer) that sends out sound waves (ultrasound). The sound waves reflect off your baby's bones and other tissues to create moving images on a computer screen. There are two types of prenatal ultrasound:  Transabdominal ultrasound. During this test, a transducer is placed on your belly and moved around. A routine transabdominal ultrasound is usually done between weeks 18 and 22 of pregnancy (standard ultrasound). It may also be done between weeks 13 and 14.  Transvaginal ultrasound. During this test, a transducer that is shaped like a wand is placed inside your vagina. This type of ultrasound is usually done during early pregnancy. Prenatal ultrasounds may be used to check:  How far along your pregnancy is (stage).  Your baby's development (gestational age).  The location and condition of the organ that supplies your baby with nourishment and oxygen (placenta).  Your baby's heart rate, position, and movements.  Your baby's approximate size and weight.  The amount of fluid surrounding your baby (amniotic fluid).  If you are carrying more than one baby.  Your baby's sex (if your baby is in a position that allows the sex organs to be seen, and if you choose to learn the sex at this time).  If there are any possible problems that require more testing, such as genetic problems.  If your pregnancy is forming outside your uterus (ectopic pregnancy). You may have other ultrasounds as needed at any point during your pregnancy. If your health care provider suspects a problem, you may also have a more detailed type of transabdominal ultrasound (advanced  ultrasound). What are the risks? Generally, this is a safe test. There are no known risks for you or your baby from a prenatal ultrasound. What happens before the test?  Before a transabdominal ultrasound, you may be asked to drink fluid 2 hours before the exam and avoid emptying your bladder. A full bladder helps the images show up more clearly.  Before a transvaginal ultrasound, you may be asked to empty your bladder before the exam.  Wear loose, comfortable clothing so it is easy to undress or expose your lower belly for the exam. What happens during the test? If you are having a transabdominal ultrasound:  You will lie on an exam table.  Your belly will be exposed.  Gel will be rubbed over your belly.  The transducer will be pressed on your belly and moved back and forth, through the gel. You may feel slight pressure, but there should not be any pain.  You may be asked to change your position.  You may hear sounds of blood flow and your baby's heartbeat. You may be able to see images of your baby on the computer screen. Your health care provider may measure your baby's head and other body parts, looking for normal development.  After the exam, the gel will be cleaned off, and you can replace your clothing. You will be able to empty your bladder after the exam is done. If you are having a transvaginal ultrasound:  You will change into a hospital gown or undress from the waist down and cover yourself with a paper sheet.  You will lie down on  an exam table with your feet in footrests (stirrups).  The transducer will be covered with a protective cover and lubricated.  The transducer will be inserted into your vagina.  You may hear sounds of blood flow and your baby's heartbeat. You may be able to see images of your baby on the computer screen.  After the exam, the transducer will be removed, and you can put your clothes back on. What can I expect after the test?  You can  drive yourself home and return to all your normal activities.  A health care provider trained in interpreting ultrasounds will review the images taken during your exam and send a report to your health care provider.  It is up to you to get your test results. Ask your health care provider, or the department that is doing the test, when your results will be ready. Questions to ask your health care provider  Why am I having this prenatal ultrasound?  What information will this exam provide?  How much does this exam cost? What costs will my insurance cover?  Can my partner or support person be with me during the exam?  When can I expect to get the results? Summary  A prenatal ultrasound is a safe and painless imaging exam that gives information about your pregnancy and your developing baby.  Transvaginal ultrasound exams are often done in early pregnancy. Standard transabdominal ultrasounds are typically done between 18 and 22 weeks of pregnancy. You may have other prenatal ultrasounds as needed.  This exam has no risks for you or your baby. After the exam, you can go home and return to all your usual activities. This information is not intended to replace advice given to you by your health care provider. Make sure you discuss any questions you have with your health care provider. Document Released: 08/06/2017 Document Revised: 09/25/2018 Document Reviewed: 08/06/2017 Elsevier Patient Education  2020 [employer].

## 2019-06-18 NOTE — L&D Delivery Note (Signed)
Twin Delivery Note Primary OB: Advance Delivery Physician: Barnett Applebaum, MD Gestational Age: Full term Antepartum complications: Di-Di Twins Intrapartum complications: None  TWIN A: A viable Female was delivered via vertex presentation. "Marie Stephenson" Apgars:8 ,9  Weight:  5 lb 7 oz .    TWIN B: A viable Female was delivered via vertex presentation. "Marie Stephenson" Apgars:8 ,9  Weight:  6 lb 9 oz .    Placenta status: 2 placentas, spontaneous and Intact.  Cord: 3+ vessels;  with the following complications: nuchal for Twin A, none for Twin B.  Anesthesia:  epidural Episiotomy:  none Lacerations:  none Suture Repair: none Est. Blood Loss (mL):  300 mL  Mom to postpartum.  Babies to Couplet care / Skin to Skin.  Barnett Applebaum, MD, [facility], [facility] 11/13/2019  8:42 PM [phone]

## 2019-07-02 NOTE — Telephone Encounter (Signed)
Do you have the [employer] rep info?

## 2019-07-12 ENCOUNTER — Ambulatory Visit (INDEPENDENT_AMBULATORY_CARE_PROVIDER_SITE_OTHER): Payer: 59 | Admitting: Obstetrics & Gynecology

## 2019-07-12 ENCOUNTER — Ambulatory Visit (INDEPENDENT_AMBULATORY_CARE_PROVIDER_SITE_OTHER): Payer: 59

## 2019-07-12 ENCOUNTER — Encounter: Payer: Self-pay | Admitting: Obstetrics & Gynecology

## 2019-07-12 ENCOUNTER — Other Ambulatory Visit: Payer: Self-pay

## 2019-07-12 VITALS — BP 120/80 | Wt 163.0 lb

## 2019-07-12 DIAGNOSIS — Z3A19 19 weeks gestation of pregnancy: Secondary | ICD-10-CM

## 2019-07-12 DIAGNOSIS — Z363 Encounter for antenatal screening for malformations: Secondary | ICD-10-CM | POA: Diagnosis not present

## 2019-07-12 DIAGNOSIS — O30049 Twin pregnancy, dichorionic/diamniotic, unspecified trimester: Secondary | ICD-10-CM

## 2019-07-12 DIAGNOSIS — Z3689 Encounter for other specified antenatal screening: Secondary | ICD-10-CM

## 2019-07-12 DIAGNOSIS — O0990 Supervision of high risk pregnancy, unspecified, unspecified trimester: Secondary | ICD-10-CM

## 2019-07-12 DIAGNOSIS — O09292 Supervision of pregnancy with other poor reproductive or obstetric history, second trimester: Secondary | ICD-10-CM

## 2019-07-12 DIAGNOSIS — O30042 Twin pregnancy, dichorionic/diamniotic, second trimester: Secondary | ICD-10-CM

## 2019-07-12 LAB — POCT URINALYSIS DIPSTICK OB
Glucose, UA: NEGATIVE
POC,PROTEIN,UA: NEGATIVE

## 2019-07-12 NOTE — Patient Instructions (Signed)

## 2019-07-12 NOTE — Telephone Encounter (Signed)
Left message for Rep to call me back, also gave the Rep number to the Pt so she can figure out what she needs to do about the bill

## 2019-07-12 NOTE — Progress Notes (Signed)
  Subjective  Fetal Movement? yes Contractions? no Nausea? no Vaginal Bleeding? no  Objective  BP 120/80   Wt 163 lb (73.9 kg)   LMP 02/26/2019 (Exact Date)   BMI 30.80 kg/m  General: NAD Pumonary: no increased work of breathing Abdomen: gravid, non-tender Extremities: no edema Psychiatric: mood appropriate, affect full  Assessment  29 y.o. G2P1001 at [redacted]w[redacted]d by  12/03/2019, by Last Menstrual Period presenting for routine prenatal visit  Plan   Problem List Items Addressed This Visit      Other   Supervision of high risk pregnancy, antepartum   Dichorionic diamniotic twin pregnancy, antepartum    Other Visit Diagnoses    [redacted] weeks gestation of pregnancy    -  Primary   Relevant Orders   POC Urinalysis Dipstick OB (Completed)      SECOND Problems (from 04/14/19 to present)    Problem Noted Resolved   History of pre-eclampsia in prior pregnancy, currently pregnant 04/15/2019 by Rexene Agent, CNM No   Supervision of high risk pregnancy, antepartum 04/14/2019 by Rexene Agent, CNM No   Overview Addendum 06/14/2019  9:02 AM by Gae Dry, MD    Clinic [facility] Prenatal Labs  Dating LMP, [place] Blood type: O/Negative/-- (10/28 1022)   Genetic Screen NIPS: Antibody:Negative (10/28 1022)  Anatomic [place] Nml Rubella: 3.42 (10/28 1022) Varicella:  Immune  GTT    Third trimester:  RPR: Non Reactive (10/28 1022)   Rhogam Needs HBsAg: Negative (10/28 1022)   TDaP vaccine          Flu Shot:declines HIV: Non Reactive (10/28 1022)   Baby Food       Breast                         GBS:   Contraception  Pap: 08/14/2018, NILM/HPV Negative  CBB  no   CS/VBAC no   Support Person Husband              Review of ULTRASOUND. I have personally reviewed images and report of recent ultrasound done at [facility]. There is a singleton gestation with subjectively normal amniotic fluid volume. The fetal biometry correlates with established dating. Detailed evaluation of the fetal anatomy  was performed.The fetal anatomical survey appears within normal limits within the resolution of ultrasound as described above.  It must be noted that a normal ultrasound is unable to rule out fetal aneuploidy.    Discussed twins and delivery  ASA for h/o preclampsia  [place] 28 weeks  Barnett Applebaum, MD, [facility], [facility] 07/12/2019  11:24 AM

## 2019-08-09 ENCOUNTER — Other Ambulatory Visit: Payer: Self-pay

## 2019-08-09 ENCOUNTER — Ambulatory Visit (INDEPENDENT_AMBULATORY_CARE_PROVIDER_SITE_OTHER): Payer: 59 | Admitting: Obstetrics & Gynecology

## 2019-08-09 ENCOUNTER — Encounter: Payer: Self-pay | Admitting: Obstetrics & Gynecology

## 2019-08-09 VITALS — BP 120/80 | Wt 174.0 lb

## 2019-08-09 DIAGNOSIS — Z6791 Unspecified blood type, Rh negative: Secondary | ICD-10-CM

## 2019-08-09 DIAGNOSIS — O26899 Other specified pregnancy related conditions, unspecified trimester: Secondary | ICD-10-CM

## 2019-08-09 DIAGNOSIS — O26892 Other specified pregnancy related conditions, second trimester: Secondary | ICD-10-CM

## 2019-08-09 DIAGNOSIS — Z3A23 23 weeks gestation of pregnancy: Secondary | ICD-10-CM

## 2019-08-09 DIAGNOSIS — Z131 Encounter for screening for diabetes mellitus: Secondary | ICD-10-CM

## 2019-08-09 DIAGNOSIS — O30042 Twin pregnancy, dichorionic/diamniotic, second trimester: Secondary | ICD-10-CM

## 2019-08-09 DIAGNOSIS — O09892 Supervision of other high risk pregnancies, second trimester: Secondary | ICD-10-CM

## 2019-08-09 DIAGNOSIS — O30049 Twin pregnancy, dichorionic/diamniotic, unspecified trimester: Secondary | ICD-10-CM

## 2019-08-09 DIAGNOSIS — O0990 Supervision of high risk pregnancy, unspecified, unspecified trimester: Secondary | ICD-10-CM

## 2019-08-09 LAB — POCT URINALYSIS DIPSTICK OB
Glucose, UA: NEGATIVE
POC,PROTEIN,UA: NEGATIVE

## 2019-08-09 NOTE — Patient Instructions (Signed)
Gestational Diabetes Mellitus, Diagnosis Gestational diabetes (gestational diabetes mellitus) is a short-term (temporary) form of diabetes that can happen during pregnancy. It goes away after you give birth. It may be caused by one or both of these problems:  Your pancreas does not make enough of a hormone called insulin.  Your body does not respond in a normal way to insulin that it makes. Insulin lets sugars (glucose) go into cells in the body. This gives you energy. If you have diabetes, sugars cannot get into cells. This causes high blood sugar (hyperglycemia). If you get gestational diabetes, you are:  More likely to get it if you get pregnant again.  More likely to develop type 2 diabetes in the future. If gestational diabetes is treated, it may not hurt you or your baby. Your doctor will set treatment goals for you. In general, you should have these blood sugar levels:  After not eating for a long time (fasting): 95 mg/dL (5.3 mmol/L).  After meals (postprandial): ? One hour after a meal: at or below 140 mg/dL (7.8 mmol/L). ? Two hours after a meal: at or below 120 mg/dL (6.7 mmol/L).  A1c (hemoglobin A1c) level: 6-6.5%. Follow these instructions at home: Questions to ask your doctor   You may want to ask these questions: ? Do I need to meet with a diabetes educator? ? What equipment will I need to care for myself at home? ? What medicines do I need? When should I take them? ? How often do I need to check my blood sugar? ? What number can I call if I have questions? ? When is my next doctor's visit? General instructions  Take over-the-counter and prescription medicines only as told by your doctor.  Stay at a healthy weight during pregnancy.  Keep all follow-up visits as told by your doctor. This is important. Contact a doctor if:  Your blood sugar is at or above 240 mg/dL (13.3 mmol/L).  Your blood sugar is at or above 200 mg/dL (11.1 mmol/L) and you have ketones in  your pee (urine).  You have been sick or have had a fever for 2 days or more and you are not getting better.  You have any of these problems for more than 6 hours: ? You cannot eat or drink. ? You feel sick to your stomach (nauseous). ? You throw up (vomit). ? You have watery poop (diarrhea). Get help right away if:  Your blood sugar is lower than 54 mg/dL (3 mmol/L).  You get confused.  You have trouble: ? Thinking clearly. ? Breathing.  Your baby moves less than normal.  You have any of these: ? Moderate or large ketone levels in your pee. ? Blood coming from your vagina. ? Unusual fluid coming from your vagina. ? Early contractions. These may feel like tightness in your belly. Summary  Gestational diabetes is a short-term form of diabetes. It can happen while you are pregnant. It goes away after you give birth.  If gestational diabetes is treated, it may not hurt you or your baby. Your doctor will set treatment goals for you.  Keep all follow-up visits as told by your doctor. This is important. This information is not intended to replace advice given to you by your health care provider. Make sure you discuss any questions you have with your health care provider. Document Revised: 07/10/2017 Document Reviewed: 07/07/2015 Elsevier Patient Education  2020 Elsevier Inc.  

## 2019-08-09 NOTE — Progress Notes (Signed)
  Subjective  Fetal Movement? yes Contractions? No but ocas BHs Leaking Fluid? no Vaginal Bleeding? no  Objective  BP 120/80   Wt 174 lb (78.9 kg)   LMP 02/26/2019 (Exact Date)   BMI 32.88 kg/m  General: NAD Pumonary: no increased work of breathing Abdomen: gravid, non-tender Extremities: no edema Psychiatric: mood appropriate, affect full  Assessment  29 y.o. G2P1001 at [redacted]w[redacted]d by  12/03/2019, by Last Menstrual Period presenting for routine prenatal visit  Plan   Problem List Items Addressed This Visit      Other   Supervision of high risk pregnancy, antepartum   Dichorionic diamniotic twin pregnancy, antepartum   Relevant Orders   US OB Follow Up   Rh negative state in antepartum period    Other Visit Diagnoses    [redacted] weeks gestation of pregnancy    -  Primary   Screening for diabetes mellitus          SECOND Problems (from 04/14/19 to present)    Problem Noted Resolved   History of pre-eclampsia in prior pregnancy, currently pregnant 04/15/2019 by Rexene Agent, CNM No   Supervision of high risk pregnancy, antepartum 04/14/2019 by Rexene Agent, CNM No   Overview Addendum 08/09/2019  9:29 AM by Gae Dry, MD    [facility] Prenatal Labs  Dating LMP, [place] Blood type: O/Negative/-- (10/28 1022)   Genetic Screen NIPS:nml, at least one XY Antibody:Negative (10/28 1022)  Anatomic [place] [facility] nml Rubella: 3.42 (10/28 1022) Varicella:  Immune  GTT    Third trimester:  RPR: Non Reactive (10/28 1022)   Rhogam Needs HBsAg: Negative (10/28 1022)   TDaP vaccine          Flu Shot:declines HIV: Non Reactive (10/28 1022)   Baby Food                Breast                GBS:   Contraception Vasec Pap: 08/14/2018, NILM/HPV Negative  CBB  no   CS/VBAC no   Support Person Husband               Prefers no Glucola, will check home FS BS one week prior to next visit     Will need other labs at [facility]  [place] and Rhogam in 5 weeks, scheduled  Twin risk factors discussed  Contraception planned- Vasec (possible BTL if CS has to be done)  Breast feeding plans discussed  Barnett Applebaum, MD, [facility], [facility] 08/09/2019  9:30 AM

## 2019-08-09 NOTE — Addendum Note (Signed)
Addended by: Quintella Baton D on: 08/09/2019 09:39 AM   Modules accepted: Orders

## 2019-08-30 ENCOUNTER — Ambulatory Visit (INDEPENDENT_AMBULATORY_CARE_PROVIDER_SITE_OTHER): Payer: 59 | Admitting: Obstetrics & Gynecology

## 2019-08-30 ENCOUNTER — Encounter: Payer: Self-pay | Admitting: Obstetrics & Gynecology

## 2019-08-30 ENCOUNTER — Other Ambulatory Visit: Payer: Self-pay

## 2019-08-30 VITALS — BP 120/80 | HR 108 | Wt 180.0 lb

## 2019-08-30 DIAGNOSIS — Z3A26 26 weeks gestation of pregnancy: Secondary | ICD-10-CM

## 2019-08-30 DIAGNOSIS — O30042 Twin pregnancy, dichorionic/diamniotic, second trimester: Secondary | ICD-10-CM

## 2019-08-30 DIAGNOSIS — O0992 Supervision of high risk pregnancy, unspecified, second trimester: Secondary | ICD-10-CM

## 2019-08-30 DIAGNOSIS — O30049 Twin pregnancy, dichorionic/diamniotic, unspecified trimester: Secondary | ICD-10-CM

## 2019-08-30 DIAGNOSIS — O0990 Supervision of high risk pregnancy, unspecified, unspecified trimester: Secondary | ICD-10-CM

## 2019-08-30 DIAGNOSIS — Z131 Encounter for screening for diabetes mellitus: Secondary | ICD-10-CM

## 2019-08-30 NOTE — Progress Notes (Signed)
  Subjective  Fetal Movement? yes Contractions? Yes - BH's daily but not excessive in intensity Leaking Fluid? no Vaginal Bleeding? no Pt reports light headedness at times, with heart racing Glucose log reviewed, all normal    (preferred over Glucola due to ill effects from drink in past) Objective  BP 120/80   Pulse (!) 108   Wt 180 lb (81.6 kg)   LMP 02/26/2019 (Exact Date)   BMI 34.01 kg/m  General: NAD Pumonary: no increased work of breathing Abdomen: gravid, non-tender Extremities: no edema Psychiatric: mood appropriate, affect full  Assessment  29 y.o. G2P1001 at [redacted]w[redacted]d by  12/03/2019, by Last Menstrual Period presenting for routine prenatal visit  Plan   Problem List Items Addressed This Visit    Supervision of high risk pregnancy, antepartum   Dichorionic diamniotic twin pregnancy, antepartum   [redacted] weeks gestation of pregnancy     Relevant Orders   ABO AND RH    CBC With Differential   RPR   Screening for diabetes mellitus           Lof reviewed, normal       SECOND Problems (from 04/14/19 to present)    Problem Plan    History of pre-eclampsia in prior pregnancy, currently pregnant ASA daily    Supervision of high risk pregnancy, antepartum [facility], [facility]    Overview Addendum 08/09/2019  9:29 AM by Gae Dry, MD    [facility] Prenatal Labs  Dating LMP, [place] Blood type: O/Negative/-- (10/28 1022)   Genetic Screen NIPS:nml, at least one XY Antibody:Negative (10/28 1022)  Anatomic [place] [facility] nml Rubella: 3.42 (10/28 1022) Varicella:  Immune  GTT     normal glc log RPR: Non Reactive (10/28 1022)   Rhogam Needs HBsAg: Negative (10/28 1022)   TDaP vaccine          Flu Shot:declines HIV: Non Reactive (10/28 1022)   Baby Food                Breast                GBS:   Contraception Vasec Pap: 08/14/2018, NILM/HPV Negative  CBB  no   CS/VBAC no   Support Person Husband               [place] nv  Rhogam nv  Glucose normal, other labs today  Twins risks discussed,  monitor for s/sx PTL  Barnett Applebaum, MD, [place] 08/30/2019  9:19 AM

## 2019-08-30 NOTE — Patient Instructions (Signed)
Third Trimester of Pregnancy The third trimester is from week 28 through week 40 (months 7 through 9). The third trimester is a time when the unborn baby (fetus) is growing rapidly. At the end of the ninth month, the fetus is about 20 inches in length and weighs 6-10 pounds. Body changes during your third trimester Your body will continue to go through many changes during pregnancy. The changes vary from woman to woman. During the third trimester:  Your weight will continue to increase. You can expect to gain 25-35 pounds (11-16 kg) by the end of the pregnancy.  You may begin to get stretch marks on your hips, abdomen, and breasts.  You may urinate more often because the fetus is moving lower into your pelvis and pressing on your bladder.  You may develop or continue to have heartburn. This is caused by increased hormones that slow down muscles in the digestive tract.  You may develop or continue to have constipation because increased hormones slow digestion and cause the muscles that push waste through your intestines to relax.  You may develop hemorrhoids. These are swollen veins (varicose veins) in the rectum that can itch or be painful.  You may develop swollen, bulging veins (varicose veins) in your legs.  You may have increased body aches in the pelvis, back, or thighs. This is due to weight gain and increased hormones that are relaxing your joints.  You may have changes in your hair. These can include thickening of your hair, rapid growth, and changes in texture. Some women also have hair loss during or after pregnancy, or hair that feels dry or thin. Your hair will most likely return to normal after your baby is born.  Your breasts will continue to grow and they will continue to become tender. A yellow fluid (colostrum) may leak from your breasts. This is the first milk you are producing for your baby.  Your belly button may stick out.  You may notice more swelling in your hands,  face, or ankles.  You may have increased tingling or numbness in your hands, arms, and legs. The skin on your belly may also feel numb.  You may feel short of breath because of your expanding uterus.  You may have more problems sleeping. This can be caused by the size of your belly, increased need to urinate, and an increase in your body's metabolism.  You may notice the fetus "dropping," or moving lower in your abdomen (lightening).  You may have increased vaginal discharge.  You may notice your joints feel loose and you may have pain around your pelvic bone. What to expect at prenatal visits You will have prenatal exams every 2 weeks until week 36. Then you will have weekly prenatal exams. During a routine prenatal visit:  You will be weighed to make sure you and the baby are growing normally.  Your blood pressure will be taken.  Your abdomen will be measured to track your baby's growth.  The fetal heartbeat will be listened to.  Any test results from the previous visit will be discussed.  You may have a cervical check near your due date to see if your cervix has softened or thinned (effaced).  You will be tested for Group B streptococcus. This happens between 35 and 37 weeks. Your health care provider may ask you:  What your birth plan is.  How you are feeling.  If you are feeling the baby move.  If you have had any abnormal   symptoms, such as leaking fluid, bleeding, severe headaches, or abdominal cramping.  If you are using any tobacco products, including cigarettes, chewing tobacco, and electronic cigarettes.  If you have any questions. Other tests or screenings that may be performed during your third trimester include:  Blood tests that check for low iron levels (anemia).  Fetal testing to check the health, activity level, and growth of the fetus. Testing is done if you have certain medical conditions or if there are problems during the pregnancy.  Nonstress test  (NST). This test checks the health of your baby to make sure there are no signs of problems, such as the baby not getting enough oxygen. During this test, a belt is placed around your belly. The baby is made to move, and its heart rate is monitored during movement. What is false labor? False labor is a condition in which you feel small, irregular tightenings of the muscles in the womb (contractions) that usually go away with rest, changing position, or drinking water. These are called Braxton Hicks contractions. Contractions may last for hours, days, or even weeks before true labor sets in. If contractions come at regular intervals, become more frequent, increase in intensity, or become painful, you should see your health care provider. What are the signs of labor?  Abdominal cramps.  Regular contractions that start at 10 minutes apart and become stronger and more frequent with time.  Contractions that start on the top of the uterus and spread down to the lower abdomen and back.  Increased pelvic pressure and dull back pain.  A watery or bloody mucus discharge that comes from the vagina.  Leaking of amniotic fluid. This is also known as your "water breaking." It could be a slow trickle or a gush. Let your health care provider know if it has a color or strange odor. If you have any of these signs, call your health care provider right away, even if it is before your due date. Follow these instructions at home: Medicines  Follow your health care provider's instructions regarding medicine use. Specific medicines may be either safe or unsafe to take during pregnancy.  Take a prenatal vitamin that contains at least 600 micrograms (mcg) of folic acid.  If you develop constipation, try taking a stool softener if your health care provider approves. Eating and drinking   Eat a balanced diet that includes fresh fruits and vegetables, whole grains, good sources of protein such as meat, eggs, or tofu,  and low-fat dairy. Your health care provider will help you determine the amount of weight gain that is right for you.  Avoid raw meat and uncooked cheese. These carry germs that can cause birth defects in the baby.  If you have low calcium intake from food, talk to your health care provider about whether you should take a daily calcium supplement.  Eat four or five small meals rather than three large meals a day.  Limit foods that are high in fat and processed sugars, such as fried and sweet foods.  To prevent constipation: ? Drink enough fluid to keep your urine clear or pale yellow. ? Eat foods that are high in fiber, such as fresh fruits and vegetables, whole grains, and beans. Activity  Exercise only as directed by your health care provider. Most women can continue their usual exercise routine during pregnancy. Try to exercise for 30 minutes at least 5 days a week. Stop exercising if you experience uterine contractions.  Avoid heavy lifting.  Do   not exercise in extreme heat or humidity, or at high altitudes.  Wear low-heel, comfortable shoes.  Practice good posture.  You may continue to have sex unless your health care provider tells you otherwise. Relieving pain and discomfort  Take frequent breaks and rest with your legs elevated if you have leg cramps or low back pain.  Take warm sitz baths to soothe any pain or discomfort caused by hemorrhoids. Use hemorrhoid cream if your health care provider approves.  Wear a good support bra to prevent discomfort from breast tenderness.  If you develop varicose veins: ? Wear support pantyhose or compression stockings as told by your healthcare provider. ? Elevate your feet for 15 minutes, 3-4 times a day. Prenatal care  Write down your questions. Take them to your prenatal visits.  Keep all your prenatal visits as told by your health care provider. This is important. Safety  Wear your seat belt at all times when driving.  Make  a list of emergency phone numbers, including numbers for family, friends, the hospital, and police and fire departments. General instructions  Avoid cat litter boxes and soil used by cats. These carry germs that can cause birth defects in the baby. If you have a cat, ask someone to clean the litter box for you.  Do not travel far distances unless it is absolutely necessary and only with the approval of your health care provider.  Do not use hot tubs, steam rooms, or saunas.  Do not drink alcohol.  Do not use any products that contain nicotine or tobacco, such as cigarettes and e-cigarettes. If you need help quitting, ask your health care provider.  Do not use any medicinal herbs or unprescribed drugs. These chemicals affect the formation and growth of the baby.  Do not douche or use tampons or scented sanitary pads.  Do not cross your legs for long periods of time.  To prepare for the arrival of your baby: ? Take prenatal classes to understand, practice, and ask questions about labor and delivery. ? Make a trial run to the hospital. ? Visit the hospital and tour the maternity area. ? Arrange for maternity or paternity leave through employers. ? Arrange for family and friends to take care of pets while you are in the hospital. ? Purchase a rear-facing car seat and make sure you know how to install it in your car. ? Pack your hospital bag. ? Prepare the baby's nursery. Make sure to remove all pillows and stuffed animals from the baby's crib to prevent suffocation.  Visit your dentist if you have not gone during your pregnancy. Use a soft toothbrush to brush your teeth and be gentle when you floss. Contact a health care provider if:  You are unsure if you are in labor or if your water has broken.  You become dizzy.  You have mild pelvic cramps, pelvic pressure, or nagging pain in your abdominal area.  You have lower back pain.  You have persistent nausea, vomiting, or  diarrhea.  You have an unusual or bad smelling vaginal discharge.  You have pain when you urinate. Get help right away if:  Your water breaks before 37 weeks.  You have regular contractions less than 5 minutes apart before 37 weeks.  You have a fever.  You are leaking fluid from your vagina.  You have spotting or bleeding from your vagina.  You have severe abdominal pain or cramping.  You have rapid weight loss or weight gain.  You have   shortness of breath with chest pain.  You notice sudden or extreme swelling of your face, hands, ankles, feet, or legs.  Your baby makes fewer than 10 movements in 2 hours.  You have severe headaches that do not go away when you take medicine.  You have vision changes. Summary  The third trimester is from week 28 through week 40, months 7 through 9. The third trimester is a time when the unborn baby (fetus) is growing rapidly.  During the third trimester, your discomfort may increase as you and your baby continue to gain weight. You may have abdominal, leg, and back pain, sleeping problems, and an increased need to urinate.  During the third trimester your breasts will keep growing and they will continue to become tender. A yellow fluid (colostrum) may leak from your breasts. This is the first milk you are producing for your baby.  False labor is a condition in which you feel small, irregular tightenings of the muscles in the womb (contractions) that eventually go away. These are called Braxton Hicks contractions. Contractions may last for hours, days, or even weeks before true labor sets in.  Signs of labor can include: abdominal cramps; regular contractions that start at 10 minutes apart and become stronger and more frequent with time; watery or bloody mucus discharge that comes from the vagina; increased pelvic pressure and dull back pain; and leaking of amniotic fluid. This information is not intended to replace advice given to you by your  health care provider. Make sure you discuss any questions you have with your health care provider. Document Revised: 09/24/2018 Document Reviewed: 07/09/2016 Elsevier Patient Education  2020 [employer]  

## 2019-08-31 ENCOUNTER — Encounter: Payer: Self-pay | Admitting: Obstetrics and Gynecology

## 2019-08-31 ENCOUNTER — Other Ambulatory Visit: Payer: Self-pay

## 2019-08-31 ENCOUNTER — Observation Stay
Admission: EM | Admit: 2019-08-31 | Discharge: 2019-09-01 | Disposition: A | Discharge status: Home / Self Care | Payer: 59 | Attending: Obstetrics and Gynecology | Admitting: Obstetrics and Gynecology

## 2019-08-31 DIAGNOSIS — O30042 Twin pregnancy, dichorionic/diamniotic, second trimester: Secondary | ICD-10-CM | POA: Diagnosis not present

## 2019-08-31 DIAGNOSIS — O09299 Supervision of pregnancy with other poor reproductive or obstetric history, unspecified trimester: Secondary | ICD-10-CM

## 2019-08-31 DIAGNOSIS — Z7982 Long term (current) use of aspirin: Secondary | ICD-10-CM | POA: Diagnosis not present

## 2019-08-31 DIAGNOSIS — Z3A26 26 weeks gestation of pregnancy: Secondary | ICD-10-CM

## 2019-08-31 DIAGNOSIS — Z6791 Unspecified blood type, Rh negative: Secondary | ICD-10-CM | POA: Diagnosis not present

## 2019-08-31 DIAGNOSIS — O360922 Maternal care for other rhesus isoimmunization, second trimester, fetus 2: Secondary | ICD-10-CM | POA: Diagnosis not present

## 2019-08-31 DIAGNOSIS — O0990 Supervision of high risk pregnancy, unspecified, unspecified trimester: Secondary | ICD-10-CM

## 2019-08-31 DIAGNOSIS — O4692 Antepartum hemorrhage, unspecified, second trimester: Secondary | ICD-10-CM | POA: Diagnosis not present

## 2019-08-31 DIAGNOSIS — O26892 Other specified pregnancy related conditions, second trimester: Secondary | ICD-10-CM | POA: Diagnosis not present

## 2019-08-31 DIAGNOSIS — O468X2 Other antepartum hemorrhage, second trimester: Secondary | ICD-10-CM | POA: Diagnosis not present

## 2019-08-31 DIAGNOSIS — O360921 Maternal care for other rhesus isoimmunization, second trimester, fetus 1: Secondary | ICD-10-CM | POA: Diagnosis not present

## 2019-08-31 DIAGNOSIS — O30049 Twin pregnancy, dichorionic/diamniotic, unspecified trimester: Secondary | ICD-10-CM

## 2019-08-31 LAB — CBC WITH DIFFERENTIAL/PLATELET
Abs Immature Granulocytes: 0.18 10*3/uL — ABNORMAL HIGH (ref 0.00–0.07)
Basophils Absolute: 0 10*3/uL (ref 0.0–0.1)
Basophils Relative: 0 %
Eosinophils Absolute: 0 10*3/uL (ref 0.0–0.5)
Eosinophils Relative: 0 %
HCT: 30.5 % — ABNORMAL LOW (ref 36.0–46.0)
Hemoglobin: 10.5 g/dL — ABNORMAL LOW (ref 12.0–15.0)
Immature Granulocytes: 2 %
Lymphocytes Relative: 17 %
Lymphs Abs: 1.5 10*3/uL (ref 0.7–4.0)
MCH: 31.9 pg (ref 26.0–34.0)
MCHC: 34.4 g/dL (ref 30.0–36.0)
MCV: 92.7 fL (ref 80.0–100.0)
Monocytes Absolute: 0.6 10*3/uL (ref 0.1–1.0)
Monocytes Relative: 6 %
Neutro Abs: 6.8 10*3/uL (ref 1.7–7.7)
Neutrophils Relative %: 75 %
Platelets: 178 10*3/uL (ref 150–400)
RBC: 3.29 MIL/uL — ABNORMAL LOW (ref 3.87–5.11)
RDW: 13.2 % (ref 11.5–15.5)
WBC: 9.1 10*3/uL (ref 4.0–10.5)
nRBC: 0 % (ref 0.0–0.2)

## 2019-08-31 LAB — URINE DRUG SCREEN, QUALITATIVE (ARMC ONLY)
Amphetamines, Ur Screen: NOT DETECTED
Barbiturates, Ur Screen: NOT DETECTED
Benzodiazepine, Ur Scrn: NOT DETECTED
Cannabinoid 50 Ng, Ur ~~LOC~~: NOT DETECTED
Cocaine Metabolite,Ur ~~LOC~~: NOT DETECTED
MDMA (Ecstasy)Ur Screen: NOT DETECTED
Methadone Scn, Ur: NOT DETECTED
Opiate, Ur Screen: NOT DETECTED
Phencyclidine (PCP) Ur S: NOT DETECTED
Tricyclic, Ur Screen: NOT DETECTED

## 2019-08-31 LAB — FIBRINOGEN: Fibrinogen: 343 mg/dL (ref 210–475)

## 2019-08-31 LAB — KLEIHAUER-BETKE STAIN
# Vials RhIg: 1
Fetal Cells %: 0 %
Quantitation Fetal Hemoglobin: 0 mL

## 2019-08-31 LAB — WET PREP, GENITAL
Clue Cells Wet Prep HPF POC: NONE SEEN
Sperm: NONE SEEN
Trich, Wet Prep: NONE SEEN
Yeast Wet Prep HPF POC: NONE SEEN

## 2019-08-31 LAB — CHLAMYDIA/NGC RT PCR (ARMC ONLY)
Chlamydia Tr: NOT DETECTED
N gonorrhoeae: NOT DETECTED

## 2019-08-31 LAB — PROTIME-INR
INR: 1 (ref 0.8–1.2)
Prothrombin Time: 12.6 seconds (ref 11.4–15.2)

## 2019-08-31 LAB — ANTIBODY SCREEN: Antibody Screen: NEGATIVE

## 2019-08-31 LAB — APTT: aPTT: 27 seconds (ref 24–36)

## 2019-08-31 MED ORDER — RHO D IMMUNE GLOBULIN 1500 UNIT/2ML IJ SOSY
300.0000 ug | PREFILLED_SYRINGE | Freq: Once | INTRAMUSCULAR | Status: AC
  Administered 2019-08-31: 300 ug via INTRAMUSCULAR
  Filled 2019-08-31: qty 2

## 2019-08-31 MED ORDER — BETAMETHASONE SOD PHOS & ACET 6 (3-3) MG/ML IJ SUSP
12.0000 mg | INTRAMUSCULAR | Status: AC
  Administered 2019-08-31 – 2019-09-01 (×2): 12 mg via INTRAMUSCULAR
  Filled 2019-08-31: qty 5

## 2019-08-31 NOTE — Progress Notes (Signed)
Pt reports that she has been to the bathroom several times and hasn't seen any more bleeding. Will continue to monitor.

## 2019-08-31 NOTE — H&P (Signed)
OB History & Physical   History of Present Illness:  Chief Complaint: vaginal bleeding  HPI:  Marie Stephenson is a 29 y.o. G2P1001 female at 40w4ddated by LMP consistent with a 7 week ultrasound.  Her pregnancy has been complicated by di-di twin gestation, Rh negative, history of preeclamsia.    She denies contractions.   She denies leakage of fluid.   She reports vaginal bleeding.   She reports fetal movement x 2.  She notes bleeding this morning at 530. She noted the blood on the toilet tissue, bright red.  Since that time she has had less bleeding and darker color.  She denies recent trauma and recent intercourse.     Total weight gain for pregnancy: 15.9 kg   Obstetrical Problem List: SECOND Problems (from 04/14/19 to present)    Problem Noted Resolved   History of pre-eclampsia in prior pregnancy, currently pregnant 04/15/2019 by SRexene Agent CNM No   Supervision of high risk pregnancy, antepartum 04/14/2019 by SRexene Agent CNM No   Overview Addendum 08/30/2019  9:23 AM by HGae Dry MD    [facility]inic [facility] Prenatal Labs  Dating LMP, [place] type: O/Negative/-- (10/28 1022)   Genetic Screen NIPS:nml, at least one XY Antibody:Negative (10/28 1022)  Anatomic [place] nml Rubella: 3.42 (10/28 1022) Varicella:  Immune  GTT Home BS log normal for one week at 26 wks  RPR: Non Reactive (10/28 1022)   Rhogam Needs HBsAg: Negative (10/28 1022)   TDaP vaccine          Flu Shot:declines HIV: Non Reactive (10/28 1022)   Baby Food Breast                GBS:   Contraception Vasec  (or BTL if CS) Pap: 08/14/2018, NILM/HPV Negative  CBB  no   CS/VBAC no   Support Person Husband                 Maternal Medical History:   Past Medical History:  Diagnosis Date  . BRCA negative 08/2018   MyRisk neg except CDK4 VUS  . Family history of breast cancer 08/2018   IBIS=11%/riskscore=8.4%  . Heart murmur    since birth  . Human papilloma virus     Past  Surgical History:  Procedure Laterality Date  . WISDOM TOOTH EXTRACTION      No Known Allergies  Prior to Admission medications   Medication Sig Start Date End Date Taking? Authorizing Provider  aspirin 81 MG chewable tablet Chew by mouth daily.   Yes [provider]  folic acid (FOLVITE) 1 MG tablet Take 1 tablet (1 mg total) by mouth daily. 04/21/19  Yes GDalia Heading CNM  Prenatal Vit-Fe Fumarate-FA (MULTIVITAMIN-PRENATAL) 27-0.8 MG TABS tablet Take 1 tablet by mouth daily at 12 noon.   Yes [provider]  UNABLE TO FIND Take 1 capsule by mouth daily. PRENATAL DHA WITH OMEGA 3 400MG    [provider]    OB History  Gravida Para Term Preterm AB Living  2 1 1     1   SAB TAB Ectopic Multiple Live Births          1    # Outcome Date GA Lbr Len/2nd Weight Sex Delivery Anes PTL Lv  2 Current           1 Term 01/12/14 [id number] g M Vag-Spont   LIV    Obstetric Comments  Menstrual age: [age]  Age 1st Pregnancy: 76. Had IOL with G1 for preeclampsia    Prenatal care site: [facility]  Social History: She  reports that she has never smoked. She has never used smokeless tobacco. She reports previous alcohol use. She reports that she does not use drugs.  Family History: family history includes Breast cancer (age of onset: 3) in her maternal grandmother; Dementia in her maternal grandmother; Endometriosis in her mother; Hypertension in her maternal grandfather and mother; Leukemia in her maternal grandfather.   Review of Systems:  Review of Systems  Constitutional: Negative.   HENT: Negative.   Eyes: Negative.   Respiratory: Negative.   Cardiovascular: Negative.   Gastrointestinal: Negative.   Genitourinary: Negative.        See HPI  Musculoskeletal: Negative.   Skin: Negative.   Neurological: Negative.   Psychiatric/Behavioral: Negative.      Physical Exam:  BP 115/67 (BP Location: Right Arm)   Pulse 93   Temp 98.4 F (36.9 C)  (Oral)   Resp 16   Ht 5' 1"  (1.549 m)   Wt 81.6 kg   LMP 02/26/2019 (Exact Date)   BMI 34.01 kg/m   Physical Exam Constitutional:      General: She is not in acute distress.    Appearance: Normal appearance. She is well-developed.  Genitourinary:     Pelvic exam was performed with patient in the lithotomy position.     Vulva, inguinal canal, urethra, bladder, vagina, uterus, right adnexa and left adnexa normal.     No posterior fourchette tenderness, injury or lesion present.     Vaginal exam comments: Very scant dark red blood in vagina.  No bleeding from cervix.     No cervical friability, lesion, bleeding or polyp.     Genitourinary Comments: SVE: closed,thick, high  HENT:     Head: Normocephalic and atraumatic.  Eyes:     General: No scleral icterus.    Conjunctiva/sclera: Conjunctivae normal.  Cardiovascular:     Rate and Rhythm: Normal rate and regular rhythm.     Heart sounds: No murmur. No friction rub. No gallop.   Pulmonary:     Effort: Pulmonary effort is normal. No respiratory distress.     Breath sounds: Normal breath sounds. No wheezing or rales.  Abdominal:     General: Bowel sounds are normal. There is no distension.     Palpations: Abdomen is soft.     Tenderness: There is no guarding or rebound.     Comments: Gravid, NT  Musculoskeletal:        General: Normal range of motion.     Cervical back: Normal range of motion and neck supple.  Neurological:     General: No focal deficit present.     Mental Status: She is alert and oriented to person, place, and time.     Cranial Nerves: No cranial nerve deficit.  Skin:    General: Skin is warm and dry.     Findings: No erythema.  Psychiatric:        Mood and Affect: Mood normal.        Behavior: Behavior normal.        Judgment: Judgment normal.   Female chaperone present for pelvic exam:    Baby A: Baseline FHR: 140 beats/min   Variability: moderate   Accelerations: present  (10x10) Decelerations:  absent  Baby B: Baseline FHR: 140 beats/min   Variability: moderate   Accelerations: present  (10x10) Decelerations: absent  Contractions: present frequency:  rare Overall assessment: cat 1 for both fetusus  Bedside Ultrasound:  Number of Fetus: 2  Presentation: Baby A: cephalic, Baby B: breech  Fluid: subjectively normal for both  Placental Location: baby A: posterior, no previa, no evidence of abruption.   Baby B: anterior, no previa, no evidence of abruption  No results found for: SARSCOV2NAA  Assessment:  Amaia Lavallie is a 29 y.o. G6P1001 female at 16w4dwith vaginal bleeding, suspect mild partial abruption.  Currently stable.   Plan:  1. Admit to Labor & Delivery  2. CBC, T&S, NPO, IVF 3. GBS: unknown.   4. Fetwal well-being: reassuring x 2 5. Abruption workup.  Will get KB, GC/CT, wet prep, UA, UDS, coags.  6. Will administer BMTZ, rhogam.  Monitor closely, if continues to bleed, may need to consider transfer given gestational age.    SPrentice Docker MD 08/31/2019 8:58 PM

## 2019-08-31 NOTE — OB Triage Note (Signed)
Pt c/o bright red vaginal bleeding noticed after using the bathroom. Pt states its was two good wipes of toilet paper. Pt reports not having recent intercourse or anything in the vagina. Pt denies pain or LOF. Pt reports positive fetal movement. Vitals WNL. Will continue to monitor.

## 2019-08-31 NOTE — Progress Notes (Signed)
Pt removed from fetal monitoring per provider order to walk. Pt walking around nurses station. Will continue to monitor.

## 2019-09-01 DIAGNOSIS — O30042 Twin pregnancy, dichorionic/diamniotic, second trimester: Secondary | ICD-10-CM | POA: Diagnosis not present

## 2019-09-01 DIAGNOSIS — O360922 Maternal care for other rhesus isoimmunization, second trimester, fetus 2: Secondary | ICD-10-CM | POA: Diagnosis not present

## 2019-09-01 DIAGNOSIS — O4692 Antepartum hemorrhage, unspecified, second trimester: Secondary | ICD-10-CM | POA: Diagnosis not present

## 2019-09-01 DIAGNOSIS — O468X2 Other antepartum hemorrhage, second trimester: Secondary | ICD-10-CM | POA: Diagnosis not present

## 2019-09-01 DIAGNOSIS — O360921 Maternal care for other rhesus isoimmunization, second trimester, fetus 1: Secondary | ICD-10-CM | POA: Diagnosis not present

## 2019-09-01 LAB — CBC WITH DIFFERENTIAL
Basophils Absolute: 0 10*3/uL (ref 0.0–0.2)
Basos: 0 %
EOS (ABSOLUTE): 0 10*3/uL (ref 0.0–0.4)
Eos: 1 %
Hematocrit: 32.3 % — ABNORMAL LOW (ref 34.0–46.6)
Hemoglobin: 11.2 g/dL (ref 11.1–15.9)
Immature Grans (Abs): 0.2 10*3/uL — ABNORMAL HIGH (ref 0.0–0.1)
Immature Granulocytes: 2 %
Lymphocytes Absolute: 1.4 10*3/uL (ref 0.7–3.1)
Lymphs: 17 %
MCH: 32.6 pg (ref 26.6–33.0)
MCHC: 34.7 g/dL (ref 31.5–35.7)
MCV: 94 fL (ref 79–97)
Monocytes Absolute: 0.6 10*3/uL (ref 0.1–0.9)
Monocytes: 7 %
Neutrophils Absolute: 6.3 10*3/uL (ref 1.4–7.0)
Neutrophils: 73 %
RBC: 3.44 x10E6/uL — ABNORMAL LOW (ref 3.77–5.28)
RDW: 12.8 % (ref 11.7–15.4)
WBC: 8.5 10*3/uL (ref 3.4–10.8)

## 2019-09-01 LAB — RHOGAM INJECTION: Unit division: 0

## 2019-09-01 LAB — ANTIBODY SCREEN: Antibody Screen: NEGATIVE

## 2019-09-01 LAB — RPR: RPR Ser Ql: NONREACTIVE

## 2019-09-01 LAB — ABO/RH: ABO/RH(D): O NEG

## 2019-09-01 LAB — ABO AND RH: Rh Factor: NEGATIVE

## 2019-09-01 NOTE — Progress Notes (Signed)
Pt reports no new bleeding or ctx this morning. Pt denies any pain. NST performed per order. Provider reviewed strip. Discussed plan of care with patient. Pt agrees and verbalizes understanding. Vitals WNL. Will continue to monitor.

## 2019-09-01 NOTE — Progress Notes (Signed)
Pt reports that she has not had any pain or bleeding since last assessed at 0120.

## 2019-09-01 NOTE — Final Progress Note (Signed)
Physician Final Progress Note  Patient ID: Marie Stephenson MRN: [id number] DOB/AGE: 1991/05/09 29 y.o.  Admit date: 08/31/2019 Admitting provider: Will Bonnet, MD Discharge date: 09/01/2019   Admission Diagnoses: Twin gestation at 26wk4d with vaginal bleeding Rh negative  Discharge Diagnoses:  Same as above Suspect mild placental abruption.   Consults: None  Significant Findings/ Diagnostic Studies: Marie Stephenson is a 32 y.o. G2P1001 female at [redacted]w[redacted]d gestation with [facility] twins.She presented with complaints of vaginal bleeding on 08/31/2019. She noted the blood on the toilet tissue, bright red, around 0530 that morning. She denied any precipitating trauma or intercourse. Speculum exam on admission to L&D revealed a scant amount of dark red blood in the vagina. There was no bleeding from the cervix which was closed, thick, and high. Fetal heart tracings on both babies were reassuring and there was no obvious evidence of abruption on ultrasound.  Marie Stephenson received Rhogam since her blood type is O negative. Abruption labs all returned negative or normal. She was observed overnight and babies were monitored intermittently vaginal bleeding on 3/18 and was discharged after receiving her second dose of betamethasone and documenting a reactive NST on both babies.   Results for orders placed or performed during the hospital encounter of 08/31/19 (from the past 72 hour(s))  Kleihauer-Betke stain     Status: None   Collection Time: 08/31/19  8:24 AM  Result Value Ref Range   Fetal Cells % 0 %   Quantitation Fetal Hemoglobin 0.0000 mL    Comment: UP TO 15 ML CORRECTED ON 03/16 AT 1214: PREVIOUSLY REPORTED AS 0    # Vials RhIg 1     Comment: Performed at [facility], [address] [place], [place] [address]  ABO/Rh     Status: None   Collection Time: 08/31/19  8:24 AM  Result Value Ref Range   ABO/RH(D)      O NEG Performed at [facility], [address] [place], [place] [address]   Antibody screen     Status: None   Collection Time: 08/31/19  8:24 AM  Result Value Ref Range   Antibody Screen      NEG Performed at [facility], [address] [place], [place] [address]   Rhogam injection     Status: None   Collection Time: 08/31/19  8:24 AM  Result Value Ref Range   Unit Number [id number]    Blood Component Type RHIG    Unit division 00    Status of Unit ISSUED,FINAL    Transfusion Status      OK TO TRANSFUSE Performed at [facility], [place], [place] [address]   CBC with Differential/Platelet     Status: Abnormal   Collection Time: 08/31/19  8:42 AM  Result Value Ref Range   WBC 9.1 4.0 - 10.5 K/uL   RBC 3.29 (L) 3.87 - 5.11 MIL/uL   Hemoglobin 10.5 (L) 12.0 - 15.0 g/dL   HCT 30.5 (L) 36.0 - 46.0 %   MCV 92.7 80.0 - 100.0 fL   MCH 31.9 26.0 - 34.0 pg   MCHC 34.4 30.0 - 36.0 g/dL   RDW 13.2 11.5 - 15.5 %   Platelets 178 150 - 400 K/uL   nRBC 0.0 0.0 - 0.2 %   Neutrophils Relative % 75 %   Neutro Abs 6.8 1.7 - 7.7 K/uL   Lymphocytes Relative 17 %   Lymphs Abs 1.5 0.7 - 4.0 K/uL  Monocytes Relative 6 %   Monocytes Absolute 0.6 0.1 - 1.0 K/uL   Eosinophils Relative 0 %   Eosinophils Absolute 0.0 0.0 - 0.5 K/uL   Basophils Relative 0 %   Basophils Absolute 0.0 0.0 - 0.1 K/uL   Immature Granulocytes 2 %   Abs Immature Granulocytes 0.18 (H) 0.00 - 0.07 K/uL    Comment: Performed at [facility], [address] [place], [place] [address]  [employer] rt PCR [facility] only)     Status: None   Collection Time: 08/31/19  8:47 AM   Specimen: Cervical/Vaginal swab  Result Value Ref Range   Specimen source GC/Chlam ENDOCERVICAL    Chlamydia Tr NOT DETECTED NOT DETECTED   N gonorrhoeae NOT DETECTED NOT DETECTED    Comment: (NOTE) This CT/NG assay has not been evaluated in patients with a history of  hysterectomy. Performed at [facility], [address] [place], [place] [address]   Wet prep, genital     Status: Abnormal   Collection Time: 08/31/19  8:47 AM   Specimen: Cervical/Vaginal swab  Result Value Ref Range   Yeast Wet Prep HPF POC NONE SEEN NONE SEEN   Trich, Wet Prep NONE SEEN NONE SEEN   Clue Cells Wet Prep HPF POC NONE SEEN NONE SEEN   WBC, Wet Prep HPF POC FEW (A) NONE SEEN   Sperm NONE SEEN     Comment: Performed at [facility], [address], [place], [place] [address]  Urine Drug Screen, Qualitative ([facility] only)     Status: None   Collection Time: 08/31/19  8:47 AM  Result Value Ref Range   Tricyclic, Ur Screen NONE DETECTED NONE DETECTED   Amphetamines, Ur Screen NONE DETECTED NONE DETECTED   MDMA (Ecstasy)Ur Screen NONE DETECTED NONE DETECTED   Cocaine Metabolite,Ur [place] NONE DETECTED NONE DETECTED   Opiate, Ur Screen NONE DETECTED NONE DETECTED   Phencyclidine (PCP) Ur S NONE DETECTED NONE DETECTED   Cannabinoid 50 Ng, Ur  NONE DETECTED NONE DETECTED   Barbiturates, Ur Screen NONE DETECTED NONE DETECTED   Benzodiazepine, Ur Scrn NONE DETECTED NONE DETECTED   Methadone Scn, Ur NONE DETECTED NONE DETECTED    Comment: (NOTE) Tricyclics + metabolites, urine    Cutoff 1000 ng/mL Amphetamines + metabolites, urine  Cutoff 1000 ng/mL MDMA (Ecstasy), urine              Cutoff 500 ng/mL Cocaine Metabolite, urine          Cutoff 300 ng/mL Opiate + metabolites, urine        Cutoff 300 ng/mL Phencyclidine (PCP), urine         Cutoff 25 ng/mL Cannabinoid, urine                 Cutoff 50 ng/mL Barbiturates + metabolites, urine  Cutoff 200 ng/mL Benzodiazepine, urine              Cutoff 200 ng/mL Methadone, urine                   Cutoff 300 ng/mL The urine drug screen provides only a preliminary, unconfirmed analytical test result and should not be used for non-medical purposes. Clinical consideration and professional judgment should be applied to any positive drug screen result due to possible interfering  substances. A more specific alternate chemical method must be used in order to obtain a confirmed analytical result. Gas chromatography / mass spectrometry (GC/MS) is the preferred confirmat ory method. Performed  at [facility], [address] [place], [place] [address]   Fibrinogen     Status: None   Collection Time: 08/31/19  9:38 AM  Result Value Ref Range   Fibrinogen 343 210 - 475 mg/dL    Comment: Performed at [facility], [address] [place], [place] [address]  Protime-INR     Status: None   Collection Time: 08/31/19  9:38 AM  Result Value Ref Range   Prothrombin Time 12.6 11.4 - 15.2 seconds   INR 1.0 0.8 - 1.2    Comment: (NOTE) INR goal varies based on device and disease states. Performed at [facility], [address] [place], [place] [address]   APTT     Status: None   Collection Time: 08/31/19  9:38 AM  Result Value Ref Range   aPTT 27 24 - 36 seconds    Comment: Performed at [facility], [address] [place], [place] [address]    Procedures: Bedside ultrasound  Discharge Condition: stable  Disposition: Discharge disposition: 01-Home or Self Care       Diet: Regular diet  Discharge Activity: No heavy lifting or strenuous exercise. Pelvic rest at least for 2 weeks  Discharge Instructions    Discharge patient   Complete by: As directed    Discharge disposition: 01-Home or Self Care   Discharge patient date: 09/01/2019     Allergies as of 09/01/2019   No Known Allergies     Medication List    TAKE these medications   aspirin 81 MG chewable tablet Chew by mouth daily.   folic acid 1 MG tablet Commonly known as: FOLVITE Take 1 tablet (1 mg total) by mouth daily.   multivitamin-prenatal 27-0.8 MG Tabs tablet Take 1 tablet by mouth daily at 12 noon.   UNABLE TO FIND Take 1 capsule by mouth daily. PRENATAL DHA WITH OMEGA 3 400MG       Follow up at [facility] next week as  scheduled or sooner prn bleeding, leakage of fluid, contractions 6 or more in 1 hour or decreased fetal movement.   Total time spent taking care of this patient: 20 minutes on 09/02/2019  Signed: Dalia Heading 09/01/2019, 2:29 PM

## 2019-09-01 NOTE — Discharge Summary (Signed)
RN reviewed discharge instructions with patient. Gave patient opportunity for questions. All questions answered at this time. Pt verbalized understanding. Pt discharged home with her husband.  

## 2019-09-06 ENCOUNTER — Ambulatory Visit (INDEPENDENT_AMBULATORY_CARE_PROVIDER_SITE_OTHER): Payer: 59 | Admitting: Obstetrics & Gynecology

## 2019-09-06 ENCOUNTER — Encounter: Payer: Self-pay | Admitting: Obstetrics & Gynecology

## 2019-09-06 ENCOUNTER — Other Ambulatory Visit: Payer: Self-pay

## 2019-09-06 VITALS — BP 120/80 | Wt 181.0 lb

## 2019-09-06 DIAGNOSIS — O4692 Antepartum hemorrhage, unspecified, second trimester: Secondary | ICD-10-CM

## 2019-09-06 DIAGNOSIS — O4693 Antepartum hemorrhage, unspecified, third trimester: Secondary | ICD-10-CM

## 2019-09-06 DIAGNOSIS — O0992 Supervision of high risk pregnancy, unspecified, second trimester: Secondary | ICD-10-CM

## 2019-09-06 DIAGNOSIS — O30042 Twin pregnancy, dichorionic/diamniotic, second trimester: Secondary | ICD-10-CM

## 2019-09-06 DIAGNOSIS — O30049 Twin pregnancy, dichorionic/diamniotic, unspecified trimester: Secondary | ICD-10-CM

## 2019-09-06 DIAGNOSIS — O0993 Supervision of high risk pregnancy, unspecified, third trimester: Secondary | ICD-10-CM

## 2019-09-06 DIAGNOSIS — Z3A27 27 weeks gestation of pregnancy: Secondary | ICD-10-CM

## 2019-09-06 NOTE — Progress Notes (Signed)
  Subjective  Fetal Movement? yes Contractions? no Leaking Fluid? no Vaginal Bleeding? no    No further bleeding since one day last week (seen on L&D, treated w BMZ, Rhogam, Observation)    Labs reviewed Objective  BP 120/80   Wt 181 lb (82.1 kg)   LMP 02/26/2019 (Exact Date)   BMI 34.20 kg/m  General: NAD Pumonary: no increased work of breathing Abdomen: gravid, non-tender Extremities: no edema Psychiatric: mood appropriate, affect full  Assessment  29 y.o. G2P1001 at [redacted]w[redacted]d by  12/03/2019, by Last Menstrual Period presenting for routine prenatal visit  Plan   Problem List Items Addressed This Visit      Other   Supervision of high risk pregnancy, antepartum   Dichorionic diamniotic twin pregnancy, antepartum   Vaginal bleeding in pregnancy, second trimester    Other Visit Diagnoses    [redacted] weeks gestation of pregnancy    -  Primary      SECOND Problems (from 04/14/19 to present)    Problem Noted Resolved   History of pre-eclampsia in prior pregnancy, currently pregnant 04/15/2019 by Rexene Agent, CNM No   Supervision of high risk pregnancy, antepartum 04/14/2019 by Rexene Agent, CNM No   Overview Addendum 08/30/2019  9:23 AM by Gae Dry, MD    Clinic [facility] Prenatal Labs  Dating LMP, [place] Blood type: O/Negative/-- (10/28 1022)   Genetic Screen NIPS:nml, at least one XY Antibody:Negative (10/28 1022)  Anatomic [place] [facility] nml Rubella: 3.42 (10/28 1022) Varicella:  Immune  GTT Home BS log normal for one week at 26 wks  RPR: Non Reactive (10/28 1022)   Rhogam 08/31/19 HBsAg: Negative (10/28 1022)   TDaP vaccine          Flu Shot:declines HIV: Non Reactive (10/28 1022)   Baby Food Breast                GBS:   Contraception Vasec  (or BTL if CS) Pap: 08/14/2018, NILM/HPV Negative  CBB  no   CS/VBAC no   Support Person Husband               Monitor for s/sx abruption  PNV, [place]  [place] nv  Glucose (home monitoring) and other labs normal  Pelvic rest advised  still  Barnett Applebaum, MD, [facility], [facility] 09/06/2019  10:19 AM

## 2019-09-13 ENCOUNTER — Other Ambulatory Visit: Payer: Self-pay | Admitting: Obstetrics and Gynecology

## 2019-09-13 ENCOUNTER — Other Ambulatory Visit: Payer: Self-pay

## 2019-09-13 ENCOUNTER — Ambulatory Visit (INDEPENDENT_AMBULATORY_CARE_PROVIDER_SITE_OTHER): Payer: 59 | Admitting: Obstetrics and Gynecology

## 2019-09-13 ENCOUNTER — Ambulatory Visit (INDEPENDENT_AMBULATORY_CARE_PROVIDER_SITE_OTHER): Payer: 59

## 2019-09-13 ENCOUNTER — Encounter: Payer: Self-pay | Admitting: Obstetrics and Gynecology

## 2019-09-13 VITALS — BP 124/76 | Wt 183.0 lb

## 2019-09-13 DIAGNOSIS — O4692 Antepartum hemorrhage, unspecified, second trimester: Secondary | ICD-10-CM

## 2019-09-13 DIAGNOSIS — Z362 Encounter for other antenatal screening follow-up: Secondary | ICD-10-CM

## 2019-09-13 DIAGNOSIS — Z3A28 28 weeks gestation of pregnancy: Secondary | ICD-10-CM

## 2019-09-13 DIAGNOSIS — O30042 Twin pregnancy, dichorionic/diamniotic, second trimester: Secondary | ICD-10-CM

## 2019-09-13 DIAGNOSIS — O09293 Supervision of pregnancy with other poor reproductive or obstetric history, third trimester: Secondary | ICD-10-CM

## 2019-09-13 DIAGNOSIS — O4693 Antepartum hemorrhage, unspecified, third trimester: Secondary | ICD-10-CM

## 2019-09-13 DIAGNOSIS — O0993 Supervision of high risk pregnancy, unspecified, third trimester: Secondary | ICD-10-CM

## 2019-09-13 DIAGNOSIS — O30043 Twin pregnancy, dichorionic/diamniotic, third trimester: Secondary | ICD-10-CM

## 2019-09-13 DIAGNOSIS — O360130 Maternal care for anti-D [Rh] antibodies, third trimester, not applicable or unspecified: Secondary | ICD-10-CM

## 2019-09-13 DIAGNOSIS — Z6791 Unspecified blood type, Rh negative: Secondary | ICD-10-CM

## 2019-09-13 DIAGNOSIS — O09299 Supervision of pregnancy with other poor reproductive or obstetric history, unspecified trimester: Secondary | ICD-10-CM

## 2019-09-13 DIAGNOSIS — O30049 Twin pregnancy, dichorionic/diamniotic, unspecified trimester: Secondary | ICD-10-CM

## 2019-09-13 NOTE — Progress Notes (Signed)
Routine Prenatal Care Visit  Subjective  Marie Stephenson is a 29 y.o. G2P1001 at [redacted]w[redacted]d being seen today for ongoing prenatal care.  She is currently monitored for the following issues for this high-risk pregnancy and has Breast mass, left; Supervision of high risk pregnancy, antepartum; History of pre-eclampsia in prior pregnancy, currently pregnant; Dichorionic diamniotic twin pregnancy, antepartum; Rh negative state in antepartum period; Indication for care in labor and delivery, antepartum; and Vaginal bleeding in pregnancy, second trimester on their problem list.  ----------------------------------------------------------------------------------- Patient reports no complaints.   Contractions: Irregular. Vag. Bleeding: None.  Movement: Present. Leaking Fluid denies.  Growth u/s today normal and concordant with normal afi She states that she has already completed 1 week of checking BG values at home. No values > 100.  She continues to check at random and still has normal values.  ----------------------------------------------------------------------------------- The following portions of the patient's history were reviewed and updated as appropriate: allergies, current medications, past family history, past medical history, past social history, past surgical history and problem list. Problem list updated.  Objective  Blood pressure 124/76, weight 183 lb (83 kg), last menstrual period 02/26/2019. Pregravid weight 145 lb (65.8 kg) Total Weight Gain 38 lb (17.2 kg) Urinalysis: Urine Protein    Urine Glucose    Fetal Status: Fetal Heart Rate (bpm): 130/147   Movement: Present     General:  Alert, oriented and cooperative. Patient is in no acute distress.  Skin: Skin is warm and dry. No rash noted.   Cardiovascular: Normal heart rate noted  Respiratory: Normal respiratory effort, no problems with respiration noted  Abdomen: Soft, gravid, appropriate for gestational age.  Pain/Pressure: Present     Pelvic:  Cervical exam deferred        Extremities: Normal range of motion.  Edema: None  Mental Status: Normal mood and affect. Normal behavior. Normal judgment and thought content.   Imaging Results US OB Follow Up  Result Date: 09/13/2019 Patient Name: Marie Stephenson DOB: Dec 02, 1990 MRN: [id number] ULTRASOUND REPORT Location: [facility] Date of Service: 09/13/2019 Indications:growth/afi Findings: Di/Di twin pregnancy is visualized with FHR at 130 BPM, twin A Biometrics give an (U/S) Gestational age of [redacted]w[redacted]d and an (U/S) EDD of 12/03/2019-A; this correlates with the clinically established Estimated Date of Delivery: 12/03/2019. FHR twin B is 147, biometrics consistent with gestational age of [redacted]w[redacted]d, EDD of 12/07/2019. Fetal presentation is Cephalic for both fetuses Placenta: anterior. Grade: 1 for A and Posterior Grade 1 for B. A is female and B is female. A MVP: 6.1 cm B MVP: 4.8 cm Growth percentile is 53.8%.  AC percentile is 69.3% Twin A Growth percentile is 40.9%. AC percentile is 23.7% Twin B. EFW: 1249 g  ( 2 lb 12 oz ) Twin A on left EFW: 1130 g ( 2 lb 8 oz ) Twin B on right Discordance is 10.5% (normal). Impression: 1. [redacted]w[redacted]d Di/Di twin Intrauterine pregnancy previously established criteria. 2. Normal growth for both twins. 10.5% discordance between the two.  Gweneth Dimitri, RT The ultrasound images and findings were reviewed by me and I agree with the above report. Prentice Docker, MD, [facility], [facility] 09/13/2019 9:38 AM     US OB Follow Up AddL Gest  Result Date: 09/13/2019 Patient Name: Marie Stephenson DOB: Nov 12, 1990 MRN: [id number] ULTRASOUND REPORT Location: [facility] Date of Service: 09/13/2019 Indications:growth/afi Findings: Di/Di twin pregnancy is visualized with FHR at 130 BPM, A Biometrics give an (U/S) Gestational age  of [redacted]w[redacted]d and an (U/S) EDD of 12/03/2019-A; this correlates with the clinically  established Estimated Date of Delivery: 12/03/19. FHR twin B is 31, gestational age of [redacted]w[redacted]d, EDD of 12/07/2019. Fetal presentation is Cephalic for both fetuses Placenta: anterior. Grade: 1 for A and Posterior Grade 1 for B. A is female and B is female. A MVP: 6.1 cm B MVP: 4.8 cm Growth percentile is 53.8%.  AC percentile is 69.3% Twin A Growth percentile is 40.9%. AC percentile is 23.7% Twin B. EFW: 1249 g  ( 2 lb 12 oz ) Twin A on left EFW: 1130 g ( 2 lb 8 oz ) Twin B on right Impression: 1. [redacted]w[redacted]d Di/Di twin Intrauterine pregnancy previously established criteria. 2. Normal growth for both twins. 10% discordance between the two.  Gweneth Dimitri, RT The ultrasound images and findings were reviewed by me and I agree with the above report. Prentice Docker, MD, [facility], [facility] 09/13/2019 9:39 AM       Assessment   29 y.o. G2P1001 at [redacted]w[redacted]d by  12/03/2019, by Last Menstrual Period presenting for routine prenatal visit  Plan   SECOND Problems (from 04/14/19 to present)    Problem Noted Resolved   History of pre-eclampsia in prior pregnancy, currently pregnant 04/15/2019 by Rexene Agent, CNM No   Supervision of high risk pregnancy, antepartum 04/14/2019 by Rexene Agent, CNM No   Overview Addendum 08/30/2019  9:23 AM by Gae Dry, MD    Clinic [facility] Prenatal Labs  Dating LMP, [place] Blood type: O/Negative/-- (10/28 1022)   Genetic Screen NIPS:nml, at least one XY Antibody:Negative (10/28 1022)  Anatomic [place] [facility] nml Rubella: 3.42 (10/28 1022) Varicella:  Immune  GTT Home BS log normal for one week at 26 wks  RPR: Non Reactive (10/28 1022)   Rhogam Needs HBsAg: Negative (10/28 1022)   TDaP vaccine          Flu Shot:declines HIV: Non Reactive (10/28 1022)   Baby Food Breast                GBS:   Contraception Vasec  (or BTL if CS) Pap: 08/14/2018, NILM/HPV Negative  CBB  no   CS/VBAC no   Support Person Husband                 Preterm labor  symptoms and general obstetric precautions including but not limited to vaginal bleeding, contractions, leaking of fluid and fetal movement were reviewed in detail with the patient. Please refer to After Visit Summary for other counseling recommendations.   Return in about 4 weeks (around 10/11/2019) for U/S for growth and Routine Prenatal Appointment.  Prentice Docker, MD, [facility], [facility] 09/13/2019 9:50 AM

## 2019-09-27 ENCOUNTER — Encounter: Payer: Self-pay | Admitting: Obstetrics & Gynecology

## 2019-09-27 ENCOUNTER — Other Ambulatory Visit: Payer: Self-pay

## 2019-09-27 ENCOUNTER — Ambulatory Visit (INDEPENDENT_AMBULATORY_CARE_PROVIDER_SITE_OTHER): Payer: 59 | Admitting: Obstetrics & Gynecology

## 2019-09-27 VITALS — BP 100/60 | Wt 185.0 lb

## 2019-09-27 DIAGNOSIS — O0993 Supervision of high risk pregnancy, unspecified, third trimester: Secondary | ICD-10-CM

## 2019-09-27 DIAGNOSIS — O30043 Twin pregnancy, dichorionic/diamniotic, third trimester: Secondary | ICD-10-CM

## 2019-09-27 DIAGNOSIS — Z23 Encounter for immunization: Secondary | ICD-10-CM | POA: Diagnosis not present

## 2019-09-27 DIAGNOSIS — Z3A30 30 weeks gestation of pregnancy: Secondary | ICD-10-CM

## 2019-09-27 DIAGNOSIS — O0990 Supervision of high risk pregnancy, unspecified, unspecified trimester: Secondary | ICD-10-CM

## 2019-09-27 LAB — POCT URINALYSIS DIPSTICK OB
Glucose, UA: NEGATIVE
POC,PROTEIN,UA: NEGATIVE

## 2019-09-27 NOTE — Progress Notes (Signed)
  Subjective  Fetal Movement? yes Contractions? no Leaking Fluid? no Vaginal Bleeding? No, but has occas old blood d/c on pad  Objective  BP 100/60   Wt 185 lb (83.9 kg)   LMP 02/26/2019 (Exact Date)   BMI 34.96 kg/m  General: NAD Pumonary: no increased work of breathing Abdomen: gravid, non-tender Extremities: no edema Psychiatric: mood appropriate, affect full SVE: closed cervix, 30 %, -3    No blood    fFN obtained Assessment  29 y.o. G2P1001 at [redacted]w[redacted]d by  12/03/2019, by Last Menstrual Period presenting for routine prenatal visit  Plan   Problem List Items Addressed This Visit    [redacted] weeks gestation of pregnancy        -  PNV    -  [place]    -  TDaP today   Supervision of high risk pregnancy in third trimester       Dichorionic diamniotic twin pregnancy in third trimester        PTL precautions discussed   Relevant Orders   Fetal fibronectin      SECOND Problems (from 04/14/19 to present)    Problem Noted Resolved   History of pre-eclampsia in prior pregnancy, currently pregnant 04/15/2019 by Rexene Agent, CNM No   Supervision of high risk pregnancy, antepartum 04/14/2019 by Rexene Agent, CNM No   Overview Addendum 08/30/2019  9:23 AM by Gae Dry, MD    Clinic [facility] Prenatal Labs  Dating LMP, [place] Blood type: O/Negative/-- (10/28 1022)   Genetic Screen NIPS:nml, at least one XY Antibody:Negative (10/28 1022)  Anatomic [place] [facility] nml Rubella: 3.42 (10/28 1022) Varicella:  Immune  GTT Home BS log normal for one week at 26 wks  RPR: Non Reactive (10/28 1022)   Rhogam Needs HBsAg: Negative (10/28 1022)   TDaP vaccine          Flu Shot:declines HIV: Non Reactive (10/28 1022)   Baby Food Breast                GBS:   Contraception Vasec  (or BTL if CS) Pap: 08/14/2018, NILM/HPV Negative  CBB  no   CS/VBAC no   Support Person Husband                 Barnett Applebaum, MD, [facility], [facility] 09/27/2019  9:08 AM

## 2019-09-27 NOTE — Addendum Note (Signed)
Addended by: Quintella Baton D on: 09/27/2019 09:33 AM   Modules accepted: Orders

## 2019-09-27 NOTE — Patient Instructions (Signed)
Braxton Hicks Contractions °Contractions of the uterus can occur throughout pregnancy, but they are not always a sign that you are in labor. You may have practice contractions called Braxton Hicks contractions. These false labor contractions are sometimes confused with true labor. °What are Braxton Hicks contractions? °Braxton Hicks contractions are tightening movements that occur in the muscles of the uterus before labor. Unlike true labor contractions, these contractions do not result in opening (dilation) and thinning of the cervix. Toward the end of pregnancy (32-34 weeks), Braxton Hicks contractions can happen more often and may become stronger. These contractions are sometimes difficult to tell apart from true labor because they can be very uncomfortable. You should not feel embarrassed if you go to the hospital with false labor. °Sometimes, the only way to tell if you are in true labor is for your health care provider to look for changes in the cervix. The health care provider will do a physical exam and may monitor your contractions. If you are not in true labor, the exam should show that your cervix is not dilating and your water has not broken. °If there are no other health problems associated with your pregnancy, it is completely safe for you to be sent home with false labor. You may continue to have Braxton Hicks contractions until you go into true labor. °How to tell the difference between true labor and false labor °True labor °· Contractions last 30-70 seconds. °· Contractions become very regular. °· Discomfort is usually felt in the top of the uterus, and it spreads to the lower abdomen and low back. °· Contractions do not go away with walking. °· Contractions usually become more intense and increase in frequency. °· The cervix dilates and gets thinner. °False labor °· Contractions are usually shorter and not as strong as true labor contractions. °· Contractions are usually irregular. °· Contractions  are often felt in the front of the lower abdomen and in the groin. °· Contractions may go away when you walk around or change positions while lying down. °· Contractions get weaker and are shorter-lasting as time goes on. °· The cervix usually does not dilate or become thin. °Follow these instructions at home: ° °· Take over-the-counter and prescription medicines only as told by your health care provider. °· Keep up with your usual exercises and follow other instructions from your health care provider. °· Eat and drink lightly if you think you are going into labor. °· If Braxton Hicks contractions are making you uncomfortable: °? Change your position from lying down or resting to walking, or change from walking to resting. °? Sit and rest in a tub of warm water. °? Drink enough fluid to keep your urine pale yellow. Dehydration may cause these contractions. °? Do slow and deep breathing several times an hour. °· Keep all follow-up prenatal visits as told by your health care provider. This is important. °Contact a health care provider if: °· You have a fever. °· You have continuous pain in your abdomen. °Get help right away if: °· Your contractions become stronger, more regular, and closer together. °· You have fluid leaking or gushing from your vagina. °· You pass blood-tinged mucus (bloody show). °· You have bleeding from your vagina. °· You have low back pain that you never had before. °· You feel your baby’s head pushing down and causing pelvic pressure. °· Your baby is not moving inside you as much as it used to. °Summary °· Contractions that occur before labor are   called Braxton Hicks contractions, false labor, or practice contractions. °· Braxton Hicks contractions are usually shorter, weaker, farther apart, and less regular than true labor contractions. True labor contractions usually become progressively stronger and regular, and they become more frequent. °· Manage discomfort from Braxton Hicks contractions  by changing position, resting in a warm bath, drinking plenty of water, or practicing deep breathing. °This information is not intended to replace advice given to you by your health care provider. Make sure you discuss any questions you have with your health care provider. °Document Revised: 05/16/2017 Document Reviewed: 10/17/2016 °Elsevier Patient Education © 2020 [employer] ° °

## 2019-09-28 LAB — FETAL FIBRONECTIN: Fetal Fibronectin: NEGATIVE

## 2019-09-28 NOTE — Progress Notes (Signed)
Let her know fFN preterm labor test is negative (good news).

## 2019-09-28 NOTE — Progress Notes (Signed)
Sent message through mychart

## 2019-10-11 ENCOUNTER — Ambulatory Visit (INDEPENDENT_AMBULATORY_CARE_PROVIDER_SITE_OTHER): Payer: 59

## 2019-10-11 ENCOUNTER — Other Ambulatory Visit: Payer: Self-pay | Admitting: Obstetrics and Gynecology

## 2019-10-11 ENCOUNTER — Other Ambulatory Visit: Payer: Self-pay

## 2019-10-11 ENCOUNTER — Encounter: Payer: Self-pay | Admitting: Obstetrics and Gynecology

## 2019-10-11 ENCOUNTER — Ambulatory Visit (INDEPENDENT_AMBULATORY_CARE_PROVIDER_SITE_OTHER): Payer: 59 | Admitting: Obstetrics and Gynecology

## 2019-10-11 ENCOUNTER — Ambulatory Visit
Admission: RE | Admit: 2019-10-11 | Discharge: 2019-10-11 | Disposition: A | Discharge status: Home / Self Care | Payer: 59 | Source: Ambulatory Visit | Attending: Obstetrics and Gynecology | Admitting: Obstetrics and Gynecology

## 2019-10-11 ENCOUNTER — Other Ambulatory Visit: Payer: 59

## 2019-10-11 ENCOUNTER — Ambulatory Visit: Admission: RE | Admit: 2019-10-11 | Payer: 59 | Source: Ambulatory Visit

## 2019-10-11 VITALS — BP 118/74 | Wt 193.0 lb

## 2019-10-11 DIAGNOSIS — O30049 Twin pregnancy, dichorionic/diamniotic, unspecified trimester: Secondary | ICD-10-CM

## 2019-10-11 DIAGNOSIS — Z3A32 32 weeks gestation of pregnancy: Secondary | ICD-10-CM

## 2019-10-11 DIAGNOSIS — O30043 Twin pregnancy, dichorionic/diamniotic, third trimester: Secondary | ICD-10-CM | POA: Diagnosis not present

## 2019-10-11 DIAGNOSIS — Z3A31 31 weeks gestation of pregnancy: Secondary | ICD-10-CM | POA: Diagnosis not present

## 2019-10-11 DIAGNOSIS — O09293 Supervision of pregnancy with other poor reproductive or obstetric history, third trimester: Secondary | ICD-10-CM

## 2019-10-11 DIAGNOSIS — O403XX2 Polyhydramnios, third trimester, fetus 2: Secondary | ICD-10-CM | POA: Insufficient documentation

## 2019-10-11 DIAGNOSIS — Z6791 Unspecified blood type, Rh negative: Secondary | ICD-10-CM

## 2019-10-11 DIAGNOSIS — O0993 Supervision of high risk pregnancy, unspecified, third trimester: Secondary | ICD-10-CM

## 2019-10-11 DIAGNOSIS — O365931 Maternal care for other known or suspected poor fetal growth, third trimester, fetus 1: Secondary | ICD-10-CM | POA: Insufficient documentation

## 2019-10-11 DIAGNOSIS — O09299 Supervision of pregnancy with other poor reproductive or obstetric history, unspecified trimester: Secondary | ICD-10-CM

## 2019-10-11 DIAGNOSIS — O26893 Other specified pregnancy related conditions, third trimester: Secondary | ICD-10-CM

## 2019-10-11 DIAGNOSIS — O0990 Supervision of high risk pregnancy, unspecified, unspecified trimester: Secondary | ICD-10-CM

## 2019-10-11 NOTE — Progress Notes (Signed)
Routine Prenatal Care Visit  Subjective  Marie Stephenson is a 29 y.o. G2P1001 at [redacted]w[redacted]d being seen today for ongoing prenatal care.  She is currently monitored for the following issues for this high-risk pregnancy and has Breast mass, left; Supervision of high risk pregnancy, antepartum; History of pre-eclampsia in prior pregnancy, currently pregnant; Dichorionic diamniotic twin pregnancy, antepartum; Rh negative state in antepartum period; Indication for care in labor and delivery, antepartum; Vaginal bleeding in pregnancy, second trimester; Intrauterine growth restriction affecting antepartum care of mother in third trimester, fetus 1; and Polyhydramnios, third trimester, fetus 2 on their problem list.  ----------------------------------------------------------------------------------- Patient reports no complaints.  She states that she has had an occasional spot of blood, unchanged from prior evaluation. Contractions: Not present. Vag. Bleeding: None.  Movement: Present. Leaking Fluid denies.  Growth u/s shows 22% discordance. Baby A has AC in 5th %ile. Normal fluid with MVP > 2cm. Baby B has normal growth, Fluid level elevated with MVP > 8cm. With umbilical artery doppler showing S/D ratio: 4.6 for baby A, S/D ratio: 2.1 for twin B (?abnormal wave forms) ----------------------------------------------------------------------------------- The following portions of the patient's history were reviewed and updated as appropriate: allergies, current medications, past family history, past medical history, past social history, past surgical history and problem list. Problem list updated.  Objective  Blood pressure 118/74, weight 193 lb (87.5 kg), last menstrual period 02/26/2019. Pregravid weight 145 lb (65.8 kg) Total Weight Gain 48 lb 8 oz (22 kg) Urinalysis: Urine Protein    Urine Glucose    Fetal Status: Fetal Heart Rate (bpm): 146/138   Movement: Present     General:  Alert,  oriented and cooperative. Patient is in no acute distress.  Skin: Skin is warm and dry. No rash noted.   Cardiovascular: Normal heart rate noted  Respiratory: Normal respiratory effort, no problems with respiration noted  Abdomen: Soft, gravid, appropriate for gestational age. Pain/Pressure: Present     Pelvic:  Cervical exam deferred        Extremities: Normal range of motion.  Edema: None  Mental Status: Normal mood and affect. Normal behavior. Normal judgment and thought content.   Imaging Results US OB Follow Up  Result Date: 10/11/2019 Patient Name: Marie Stephenson DOB: 07/17/1990 MRN: [id number] ULTRASOUND REPORT Location: [facility] Date of Service: 10/11/2019 Indications:growth/afi of Di/di twins Findings: Di/Di twin intrauterine pregnancy is visualized: Twin A FHR at 146 BPM. Biometrics give an (U/S) Gestational age of [redacted]w[redacted]d and an (U/S) EDD of 12/09/2019; this correlates with the clinically established Estimated Date of Delivery: 12/03/19. Twin B has a  FHR at 138 BPM. Biometrics give an (U/S) Gestational age of [redacted]w[redacted]d and an (U/S) EDD of 11/27/2019; this correlates with the clinically established Estimated Date of Delivery: 12/03/19. Fetal presentation is Cephalic for Twin A and Pilar Plate Breech for Twin B. Placenta: posterior. Grade: 2 for Twin A and anterior, Grade 2 for B. MVP A: 3.4 cm MVP B: 8.4 cm Growth percentile is 26.1%.  AC percentile is 5.1% for Twin A.  and Growth percentile is 60.9% and AC percentile is 82.7% for Twin B. EFW Twin A: 1,698 g ( 3 lb 12 oz ) EFW Twin B: 2,184 g ( 4 lb 13 oz ) Growth discordance is 22% between the twins. BPP Scoring: For Both twins Movement: 2/2 Tone: 2/2 Breathing: 2/2 AFI: 2/2 Umbilical Artery Dopplers were performed due to discordant growth, fluid and Twin A - AC in the 5th percentile. Systolic and Diastolic blood flow  in each umbilical artery appear abnormal and without reversal or absence of diastolic flow. The maximum S/D ratio is 4.6 for  Twin A and 2.1 for Twin B.  Twin A's S/D ratio is high and Twin B's S/D ratio irregular waveform for some of the measurements. According to perinatology.com, this ratio is abnormal for this gestational age. Impression: 1. [redacted]w[redacted]d Viable Di/Di twin  Intrauterine pregnancy dated by previously established criteria. 2. Twin Growth discordance is 22%. Twin A has an AC measurement of 5%. Twin B's is 83%. 3. Twin B has higher amniotic fluid levels than Twin A, with concern for polyhydramnios in Twin B. 4. BPP is 8/8 for both Twins. 5. Abnormal umbilical artery dopplers for both twins. Twin A's was high and B's with an abnormal waveform for some of the dopplers taken. Recommendations: 1.Clinical correlation with the patient's History and Physical Exam. 2. MFM referral for further characterization and recommendations. I discussed with Dr. Lehman Prom at [facility] MFM who has agreed to see her this morning for follow up. Gweneth Dimitri, RT The ultrasound images and findings were reviewed by me and I agree with the above report.  Note: there are some limitations to this ultrasound. Prentice Docker, MD, [facility], [facility] 10/11/2019 10:09 AM     US FETAL BPP WO NON STRESS  Result Date: 10/11/2019 Patient Name: Marie Stephenson DOB: 02/13/1991 MRN: [id number] ULTRASOUND REPORT Location: [facility] Date of Service: 10/11/2019 Indications:growth/afi of Di/di twins Findings: Di/Di twin intrauterine pregnancy is visualized:  Twin A FHR at 146 BPM.  Biometrics give an (U/S) Gestational age of [redacted]w[redacted]d and an (U/S) EDD of 12/09/2019; this correlates with the clinically established Estimated Date of Delivery: 12/03/19.  Twin B has a  FHR at 138 BPM. Biometrics give an (U/S) Gestational age of [redacted]w[redacted]d and an (U/S) EDD of 11/27/2019; this correlates with the clinically established Estimated Date of Delivery: 12/03/19.  Fetal presentation is Cephalic for Twin A and Pilar Plate Breech for Twin B.  Placenta:  posterior. Grade: 2 for Twin A and anterior, Grade 2 for B. MVP A: 3.4 cm MVP B: 8.4 cm  Growth percentile is 26.1%.  AC percentile is 5.1% for Twin A.   and Growth percentile is 60.9% and AC percentile is 82.7% for Twin B.  EFW Twin A: 1,698 g ( 3 lb 12 oz ) EFW Twin B: 2,184 g ( 4 lb 13 oz ) Growth discordance is 22% between the twins.   BPP Scoring: For Both twins Movement: 2/2 Tone: 2/2 Breathing: 2/2 AFI: 2/2  Umbilical Artery Dopplers were performed due to discordant growth, fluid and Twin A - AC in the 5th percentile.  Systolic and Diastolic blood flow in each umbilical artery appear abnormal and without reversal or absence of diastolic flow.  The maximum S/D ratio is 4.6 for Twin A and 2.1 for Twin B.  Twin A's S/D ratio is high and Twin B's S/D ratio irregular waveform for some of the measurements.  According to perinatology.com, this ratio is abnormal for this gestational age.  Impression: 1. [redacted]w[redacted]d Viable Di/Di twin  Intrauterine pregnancy dated by previously established criteria. 2. Twin Growth discordance is 22%. Twin A has an AC measurement of 5%. Twin B's is 83%. 3. Twin B has higher amniotic fluid levels than Twin A, with concern for polyhydramnios in Twin B. 4. BPP is 8/8 for both Twins. 5. Abnormal umbilical artery dopplers for both twins. Twin A's was high and B's with an  abnormal waveform for some of the dopplers taken.  Recommendations: 1.Clinical correlation with the patient's History and Physical Exam. 2. MFM referral for further characterization and recommendations. I discussed with Dr. Lehman Prom at [facility] MFM who has agreed to see her this morning for follow up.  Gweneth Dimitri, RT  The ultrasound images and findings were reviewed by me and I agree with the above report.  Note: there are some limitations to this ultrasound.  Prentice Docker, MD, [facility], [facility] 10/11/2019 10:09 AM      Assessment   29 y.o. G2P1001 at [redacted]w[redacted]d by  12/03/2019, by  Last Menstrual Period presenting for routine prenatal visit  Plan   SECOND Problems (from 04/14/19 to present)    Problem Noted Resolved   Intrauterine growth restriction affecting antepartum care of mother in third trimester, fetus 1 10/11/2019 by Will Bonnet, MD No   Polyhydramnios, third trimester, fetus 2 10/11/2019 by Will Bonnet, MD No   History of pre-eclampsia in prior pregnancy, currently pregnant 04/15/2019 by Rexene Agent, CNM No   Supervision of high risk pregnancy, antepartum 04/14/2019 by Rexene Agent, CNM No   Overview Addendum 09/27/2019  9:10 AM by Gae Dry, MD    Clinic [facility] Prenatal Labs  Dating LMP, [place] Blood type: O/Negative/-- (10/28 1022)   Genetic Screen NIPS:nml, at least one XY Antibody:Negative (10/28 1022)  Anatomic [place] [facility] nml Rubella: 3.42 (10/28 1022) Varicella:  Immune  GTT Home BS log normal for one week at 26 wks  RPR: Non Reactive (10/28 1022)   Rhogam Needs HBsAg: Negative (10/28 1022)   TDaP vaccine  4/12      Flu Shot:declines HIV: Non Reactive (10/28 1022)   Baby Food Breast                GBS:   Contraception Vasec  (or BTL if CS) Pap: 08/14/2018, NILM/HPV Negative  CBB  no   CS/VBAC no   Support Person Husband                 Preterm labor symptoms and general obstetric precautions including but not limited to vaginal bleeding, contractions, leaking of fluid and fetal movement were reviewed in detail with the patient. Please refer to After Visit Summary for other counseling recommendations.   - Discussed findings with patient today.  I spoke directly with Dr. Lehman Prom at [facility] MFM. They have an opening today to see the patient to assess our findings and to verify them along with any treatment recommendations they might have.  Patient voiced understanding and will head over to [facility] MFM clinic at this time. WIll follow up with Dr. Lehman Prom after evaluation of patient.   Follow up below may be different  depending on findings at [place] clinic today.  Return in about 2 weeks (around 10/25/2019) for Routine Prenatal Appointment.  Prentice Docker, MD, [facility], [facility] 10/11/2019 1:06 PM

## 2019-10-13 ENCOUNTER — Ambulatory Visit (INDEPENDENT_AMBULATORY_CARE_PROVIDER_SITE_OTHER): Payer: 59 | Admitting: Obstetrics & Gynecology

## 2019-10-13 ENCOUNTER — Encounter: Payer: Self-pay | Admitting: Obstetrics & Gynecology

## 2019-10-13 ENCOUNTER — Other Ambulatory Visit: Payer: Self-pay

## 2019-10-13 VITALS — BP 100/70 | Wt 193.0 lb

## 2019-10-13 DIAGNOSIS — O4693 Antepartum hemorrhage, unspecified, third trimester: Secondary | ICD-10-CM

## 2019-10-13 NOTE — Addendum Note (Signed)
Addended by: Gae Dry on: 10/13/2019 11:03 AM   Modules accepted: Orders

## 2019-10-13 NOTE — Progress Notes (Signed)
Problem Visit     Chief Complaint  Patient presents with  . Vaginal Bleeding   History of Present Illness: Patient is a 29 y.o. G2P1001 7w5dpresenting for third trimester bleeding.  The onset of bleeding was this am and is significant in amount of blood, new for her.  She reports ctx like pain twice last night, then rested well, then noted bleeding upon awakening (without cramps this am).  No associated other findings.  No recent trauma or intercourse.  Babies moving well.  She has not had a prior h/o vaginal bleeding episodes prior to pregnancy or with last pregnancy. Rh status: O NEG, has received Rhogam PTL Status: has received BMZ at prior hospitalization  PMHx: She  has a past medical history of BRCA negative (08/2018), Family history of breast cancer (08/2018), Heart murmur, and Human papilloma virus. Also,  has a past surgical history that includes Wisdom tooth extraction., family history includes Breast cancer (age of onset: 357 in her maternal grandmother; Dementia in her maternal grandmother; Endometriosis in her mother; Hypertension in her maternal grandfather and mother; Leukemia in her maternal grandfather.,  reports that she has never smoked. She has never used smokeless tobacco. She reports previous alcohol use. She reports that she does not use drugs.  She has a current medication list which includes the following prescription(s): aspirin, folic acid, multivitamin-prenatal, UNABLE TO FIND, and UNABLE TO FIND. Also, has No Known Allergies.  Review of Systems  Constitutional: Positive for malaise/fatigue. Negative for chills and fever.  HENT: Negative for congestion, sinus pain and sore throat.   Eyes: Negative for blurred vision and pain.  Respiratory: Negative for cough and wheezing.   Cardiovascular: Negative for chest pain and leg swelling.  Gastrointestinal: Positive for abdominal pain. Negative for constipation, diarrhea, heartburn, nausea and vomiting.  Genitourinary:  Negative for dysuria, frequency, hematuria and urgency.  Musculoskeletal: Negative for back pain, joint pain, myalgias and neck pain.  Skin: Negative for itching and rash.  Neurological: Negative for dizziness, tremors and weakness.  Endo/Heme/Allergies: Does not bruise/bleed easily.  Psychiatric/Behavioral: Negative for depression. The patient is not nervous/anxious and does not have insomnia.     Objective: Vitals:   10/13/19 0931  BP: 100/70   Physical Exam Vitals reviewed.   Physical examination Constitutional NAD, Conversant  Skin No rashes, lesions or ulceration. Normal palpated skin turgor. No nodularity.  Lungs: Clear to auscultation.No rales or wheezes. Normal Respiratory effort, no retractions.  Heart: NSR.  No murmurs or rubs appreciated. No periferal edema  Abdomen: Gravid.  Non-tender.  No masses.  No HSM. No hernia FHT 150s for Twin A RLQ, and FHT 150s for Twin B LUQ  Extremities: Moves all appropriately.  Normal ROM for age. No lymphadenopathy.  Neuro: Grossly intact  Psych: Oriented to PPT.  Normal mood. Normal affect.     Pelvic:   Vulva: Normal appearance.  No lesions.  Vagina: No lesions or abnormalities noted.  NO BLOOD on SPEC EXAM or on glove  Urethra No masses tenderness or scarring.  Meatus Normal size without lesions or prolapse.  Cervix: Closed, 30%, -3, Vtx.   Perineum: Normal exam.  No lesions.        Bimanual   Uterus: Enlarged.  Non-tender.    Adnexae: Not palpated.  Cul-de-sac: Negative for abnormality.      Assessment: 29y.o. G2P1001 332w5dTwins  1. Third trimester bleeding New onset problem, new workup  Plan: 1) THIRD trimester bleeding - incidence and clinical course of  third trimester bleeding is discussed in detail with the patient today.  Likely cervical.  [place] reviewed from 48 hours ago, normal.  No s.sx abruption.  No previa.  No vaginal lacerations or trauma.   2) The patient is Rh NEG, rhogam is therefore indicated yet up to  date to decrease the risk rhesus alloimmunization.    3) Routine bleeding precautions were discussed with the patient prior the conclusion of today's visit.  4) Will hold on further [place] or imaging at this time.  Plan extended fetal monitoring if pattern develops.  Plan [place] apptointment for growth and placental monitoring. Scheduled.  Barnett Applebaum, MD, [facility], [facility] 10/13/2019  9:51 AM

## 2019-10-14 LAB — FETAL FIBRONECTIN: Fetal Fibronectin: NEGATIVE

## 2019-10-18 ENCOUNTER — Encounter: Payer: Self-pay | Admitting: Obstetrics and Gynecology

## 2019-10-18 ENCOUNTER — Ambulatory Visit (INDEPENDENT_AMBULATORY_CARE_PROVIDER_SITE_OTHER): Payer: 59 | Admitting: Obstetrics and Gynecology

## 2019-10-18 ENCOUNTER — Other Ambulatory Visit: Payer: Self-pay

## 2019-10-18 VITALS — BP 112/68 | Wt 199.0 lb

## 2019-10-18 DIAGNOSIS — O0990 Supervision of high risk pregnancy, unspecified, unspecified trimester: Secondary | ICD-10-CM | POA: Diagnosis not present

## 2019-10-18 DIAGNOSIS — O403XX2 Polyhydramnios, third trimester, fetus 2: Secondary | ICD-10-CM

## 2019-10-18 DIAGNOSIS — O26899 Other specified pregnancy related conditions, unspecified trimester: Secondary | ICD-10-CM

## 2019-10-18 DIAGNOSIS — Z3A33 33 weeks gestation of pregnancy: Secondary | ICD-10-CM

## 2019-10-18 DIAGNOSIS — O30049 Twin pregnancy, dichorionic/diamniotic, unspecified trimester: Secondary | ICD-10-CM

## 2019-10-18 DIAGNOSIS — O365931 Maternal care for other known or suspected poor fetal growth, third trimester, fetus 1: Secondary | ICD-10-CM

## 2019-10-18 DIAGNOSIS — Z6791 Unspecified blood type, Rh negative: Secondary | ICD-10-CM

## 2019-10-18 DIAGNOSIS — O4693 Antepartum hemorrhage, unspecified, third trimester: Secondary | ICD-10-CM

## 2019-10-18 DIAGNOSIS — O09299 Supervision of pregnancy with other poor reproductive or obstetric history, unspecified trimester: Secondary | ICD-10-CM

## 2019-10-18 LAB — POCT URINALYSIS DIPSTICK OB
Glucose, UA: NEGATIVE
POC,PROTEIN,UA: NEGATIVE

## 2019-10-18 NOTE — Progress Notes (Signed)
Routine Prenatal Care Visit  Subjective  Marie Stephenson is a 29 y.o. G2P1001 at [redacted]w[redacted]d being seen today for ongoing prenatal care.  She is currently monitored for the following issues for this high-risk pregnancy and has Breast mass, left; Supervision of high risk pregnancy, antepartum; History of pre-eclampsia in prior pregnancy, currently pregnant; Dichorionic diamniotic twin pregnancy, antepartum; Rh negative state in antepartum period; Indication for care in labor and delivery, antepartum; Vaginal bleeding in pregnancy, second trimester; Intrauterine growth restriction affecting antepartum care of mother in third trimester, fetus 1; and Polyhydramnios, third trimester, fetus 2 on their problem list.  ----------------------------------------------------------------------------------- Patient reports on going vaginal bleeding since 26 weeks. Small to moderate amounts at least once a week. Always bright red..   Contractions: Irregular. Vag. Bleeding: Scant.  Movement: Present. Denies leaking of fluid.  ----------------------------------------------------------------------------------- The following portions of the patient's history were reviewed and updated as appropriate: allergies, current medications, past family history, past medical history, past social history, past surgical history and problem list. Problem list updated.   Objective  Blood pressure 112/68, weight 199 lb (90.3 kg), last menstrual period 02/26/2019. Pregravid weight 145 lb (65.8 kg) Total Weight Gain 54 lb (24.5 kg) Urinalysis:      Fetal Status: Fetal Heart Rate (bpm): 150/135   Movement: Present     General:  Alert, oriented and cooperative. Patient is in no acute distress.  Skin: Skin is warm and dry. No rash noted.   Cardiovascular: Normal heart rate noted  Respiratory: Normal respiratory effort, no problems with respiration noted  Abdomen: Soft, gravid, appropriate for gestational age. Pain/Pressure:  Present     Pelvic:  Cervical exam performed       cervix vascular appearance with superficial vessels noted. Friable appearance. Visually closed.   Extremities: Normal range of motion.  Edema: None  Mental Status: Normal mood and affect. Normal behavior. Normal judgment and thought content.     Assessment   29 y.o. G2P1001 at [redacted]w[redacted]d by  12/03/2019, by Last Menstrual Period presenting for routine prenatal visit  Plan   SECOND Problems (from 04/14/19 to present)    Problem Noted Resolved   Intrauterine growth restriction affecting antepartum care of mother in third trimester, fetus 1 10/11/2019 by Will Bonnet, MD No   Polyhydramnios, third trimester, fetus 2 10/11/2019 by Will Bonnet, MD No   History of pre-eclampsia in prior pregnancy, currently pregnant 04/15/2019 by Rexene Agent, CNM No   Supervision of high risk pregnancy, antepartum 04/14/2019 by Rexene Agent, CNM No   Overview Addendum 10/18/2019 11:24 AM by Homero Fellers, [place] Prenatal Labs  Dating LMP, [place] Blood type: O/Negative/-- (10/28 1022)   Genetic Screen NIPS:nml, at least one XY Antibody:Negative (10/28 1022)  Anatomic [place] [facility] nml Rubella: 3.42 (10/28 1022) Varicella:  Immune  GTT Home BS log normal for one week at 26 wks  RPR: Non Reactive (10/28 1022)   Rhogam 08/31/2019 HBsAg: Negative (10/28 1022)   TDaP vaccine  4/12      Flu Shot:declines HIV: Non Reactive (10/28 1022)   Baby Food Breast                GBS:   Contraception Vasec  (or BTL if CS) Pap: 08/14/2018, NILM/HPV Negative  CBB  no   CS/VBAC no   Support Person Husband                 Nuswab for ongoing vaginal  bleeding, likely secondary to friable appearance of cervix on speculum exam. Patient not having intercourse.  2020- pap NIL  Gestational age appropriate obstetric precautions including but not limited to vaginal bleeding, contractions, leaking of fluid and fetal movement were reviewed in detail with  the patient.    Return in about 1 week (around 10/25/2019) for ROB in person.  Homero Fellers MD [facility], [facility] 10/18/2019, 11:34 AM

## 2019-10-20 ENCOUNTER — Other Ambulatory Visit: Payer: Self-pay | Admitting: Obstetrics and Gynecology

## 2019-10-20 DIAGNOSIS — B9689 Other specified bacterial agents as the cause of diseases classified elsewhere: Secondary | ICD-10-CM

## 2019-10-20 LAB — NUSWAB BV AND CANDIDA, NAA
Atopobium vaginae: HIGH Score — AB
BVAB 2: HIGH Score — AB
Candida albicans, NAA: NEGATIVE
Candida glabrata, NAA: NEGATIVE
Megasphaera 1: HIGH Score — AB

## 2019-10-20 MED ORDER — METRONIDAZOLE 0.75 % VA GEL: 1.0000 | Freq: Every day | VAGINAL | 1 refills | Status: AC

## 2019-10-25 ENCOUNTER — Other Ambulatory Visit: Payer: Self-pay

## 2019-10-25 ENCOUNTER — Ambulatory Visit (INDEPENDENT_AMBULATORY_CARE_PROVIDER_SITE_OTHER): Payer: 59 | Admitting: Obstetrics & Gynecology

## 2019-10-25 ENCOUNTER — Encounter: Payer: Self-pay | Admitting: Obstetrics & Gynecology

## 2019-10-25 VITALS — BP 120/80 | Wt 198.0 lb

## 2019-10-25 DIAGNOSIS — O30049 Twin pregnancy, dichorionic/diamniotic, unspecified trimester: Secondary | ICD-10-CM

## 2019-10-25 DIAGNOSIS — O0993 Supervision of high risk pregnancy, unspecified, third trimester: Secondary | ICD-10-CM

## 2019-10-25 DIAGNOSIS — Z3A34 34 weeks gestation of pregnancy: Secondary | ICD-10-CM

## 2019-10-25 LAB — POCT URINALYSIS DIPSTICK OB
Glucose, UA: NEGATIVE
POC,PROTEIN,UA: NEGATIVE

## 2019-10-25 NOTE — Progress Notes (Signed)
  Subjective  Fetal Movement? yes Contractions? no Leaking Fluid? no Vaginal Bleeding? Yes, but scant   Has not taken BV medicine yet, wanted to discuss further  Objective  BP 120/80   Wt 198 lb (89.8 kg)   LMP 02/26/2019 (Exact Date)   BMI 37.41 kg/m  General: NAD Pumonary: no increased work of breathing Abdomen: gravid, non-tender Extremities: no edema Psychiatric: mood appropriate, affect full  Assessment  29 y.o. G2P1001 at 101w3d by  12/03/2019, by Last Menstrual Period presenting for routine prenatal visit  Plan   Problem List Items Addressed This Visit      Other   Supervision of high risk pregnancy, antepartum   Dichorionic diamniotic twin pregnancy, antepartum   Relevant Orders   US OB Limited    Other Visit Diagnoses    [redacted] weeks gestation of pregnancy    -  Primary      SECOND Problems (from 04/14/19 to present)    Problem Noted Resolved   Intrauterine growth restriction affecting antepartum care of mother in third trimester, fetus 1 10/11/2019 by Will Bonnet, MD No   Polyhydramnios, third trimester, fetus 2 10/11/2019 by Will Bonnet, MD No   History of pre-eclampsia in prior pregnancy, currently pregnant 04/15/2019 by Rexene Agent, CNM No   Supervision of high risk pregnancy, antepartum 04/14/2019 by Rexene Agent, CNM No   Overview Addendum 10/18/2019 11:24 AM by Homero Fellers, [place] Prenatal Labs  Dating LMP, [place] Blood type: O/Negative/-- (10/28 1022)   Genetic Screen NIPS:nml, at least one XY Antibody:Negative (10/28 1022)  Anatomic [place] [facility] nml Rubella: 3.42 (10/28 1022) Varicella:  Immune  GTT Home BS log normal for one week at 26 wks  RPR: Non Reactive (10/28 1022)   Rhogam 08/31/2019 HBsAg: Negative (10/28 1022)   TDaP vaccine  4/12      Flu Shot:declines HIV: Non Reactive (10/28 1022)   Baby Food Breast                GBS:   Contraception Vasec  (or BTL if CS) Pap: 08/14/2018, NILM/HPV Negative  CBB  no    CS/VBAC no   Support Person Husband               [place] for growth w MFM next week  APT 36 weeks  IOL 37-38 weeks, discussed pros and cons  PNV, [place]  Take Metrogel for BV, discussed pros and cons  GBS next week  Barnett Applebaum, MD, [facility], [facility] 10/25/2019  9:14 AM

## 2019-10-25 NOTE — Patient Instructions (Signed)

## 2019-10-25 NOTE — Addendum Note (Signed)
Addended by: Quintella Baton D on: 10/25/2019 09:21 AM   Modules accepted: Orders

## 2019-11-01 ENCOUNTER — Other Ambulatory Visit: Payer: Self-pay

## 2019-11-01 ENCOUNTER — Encounter: Payer: Self-pay | Admitting: Obstetrics & Gynecology

## 2019-11-01 ENCOUNTER — Ambulatory Visit
Admission: RE | Admit: 2019-11-01 | Discharge: 2019-11-01 | Disposition: A | Discharge status: Home / Self Care | Payer: 59 | Source: Ambulatory Visit | Attending: Maternal & Fetal Medicine | Admitting: Maternal & Fetal Medicine

## 2019-11-01 ENCOUNTER — Ambulatory Visit (INDEPENDENT_AMBULATORY_CARE_PROVIDER_SITE_OTHER): Payer: 59 | Admitting: Obstetrics & Gynecology

## 2019-11-01 VITALS — BP 120/80 | Wt 200.0 lb

## 2019-11-01 DIAGNOSIS — O30043 Twin pregnancy, dichorionic/diamniotic, third trimester: Secondary | ICD-10-CM | POA: Diagnosis not present

## 2019-11-01 DIAGNOSIS — Z3A35 35 weeks gestation of pregnancy: Secondary | ICD-10-CM | POA: Insufficient documentation

## 2019-11-01 DIAGNOSIS — Z3685 Encounter for antenatal screening for Streptococcus B: Secondary | ICD-10-CM

## 2019-11-01 DIAGNOSIS — O09299 Supervision of pregnancy with other poor reproductive or obstetric history, unspecified trimester: Secondary | ICD-10-CM

## 2019-11-01 DIAGNOSIS — O30049 Twin pregnancy, dichorionic/diamniotic, unspecified trimester: Secondary | ICD-10-CM

## 2019-11-01 DIAGNOSIS — O403XX2 Polyhydramnios, third trimester, fetus 2: Secondary | ICD-10-CM

## 2019-11-01 DIAGNOSIS — O0993 Supervision of high risk pregnancy, unspecified, third trimester: Secondary | ICD-10-CM

## 2019-11-01 DIAGNOSIS — O365931 Maternal care for other known or suspected poor fetal growth, third trimester, fetus 1: Secondary | ICD-10-CM

## 2019-11-01 DIAGNOSIS — O0990 Supervision of high risk pregnancy, unspecified, unspecified trimester: Secondary | ICD-10-CM

## 2019-11-01 LAB — POCT URINALYSIS DIPSTICK OB
Glucose, UA: NEGATIVE
POC,PROTEIN,UA: NEGATIVE

## 2019-11-01 NOTE — Progress Notes (Signed)
  [facility] REGIONAL BIRTHPLACE INDUCTION ASSESSMENT SCHEDULING Marie Stephenson 12-21-1990 Medical record #: [id number] Phone #:  Home Phone [phone]  Mobile [phone]    [facility]atal Provider:[facility] Proposed admission date/time:11/13/19 at 0800 Method of induction:Cytotec  Weight: Filed Weights05/17/21 1109Weight:200 lb (90.7 kg) BMI Body mass index is 37.79 kg/m. HIV Negative HSV Negative EDC Estimated Date of Delivery: 6/18/21based on:LMP  Gestational age on admission: 37 Gravidity/parity:G2P1001  Cervix Score   0 1 2 3   Position Posterior Midposition Anterior   Consistency Firm Medium Soft   Effacement (%) 0-30 40-50 60-70 >80  Dilation (cm) Closed 1-2 3-4 >5  Baby's station -3 -2 -1 +1, +2   Bishop Score:4   Medical induction of labor TWINS  Medical Indications Adapted from [place] #[id number], "Medically Indicated Late Preterm and Early Term Deliveries," 2013.  PLACENTAL / UTERINE ISSUES FETAL ISSUES MATERNAL ISSUES  ?  ? Multiple Gestation ?   ?  ? Di-Di (38.0-38.6) ?   For indications not listed above, delivery recommendations from maternal-fetal medicine consultant occurred on: Date:5/17/21with Dr. Diamantina Monks for indication of: Twins  Provider Signature: Hoyt Koch Scheduled [id number] Date:11/01/2019 11:27 AM   Call [phone] to finalize the induction date/time  [id number] (07/17)

## 2019-11-01 NOTE — Progress Notes (Signed)
  Subjective  Fetal Movement? yes Contractions? No, occas BHs Leaking Fluid? no Vaginal Bleeding? no  Objective  BP 120/80   Wt 200 lb (90.7 kg)   LMP 02/26/2019 (Exact Date)   BMI 37.79 kg/m  General: NAD Pumonary: no increased work of breathing Abdomen: gravid, non-tender Extremities: no edema Psychiatric: mood appropriate, affect full  Assessment  29 y.o. G2P1001 at [redacted]w[redacted]d by  12/03/2019, by Last Menstrual Period presenting for routine prenatal visit  Plan   Problem List Items Addressed This Visit      Other   Dichorionic diamniotic twin pregnancy, antepartum    Other Visit Diagnoses    [redacted] weeks gestation of pregnancy    -  Primary   Relevant Orders   POC Urinalysis Dipstick OB (Completed)   Encounter for antenatal screening for Streptococcus B       Relevant Orders   Culture, beta strep (group b only)   Supervision of high risk pregnancy in third trimester          SECOND Problems (from 04/14/19 to present)    Problem Noted Resolved   Intrauterine growth restriction affecting antepartum care of mother in third trimester, fetus 1 10/11/2019 by Will Bonnet, MD No   Polyhydramnios, third trimester, fetus 2 10/11/2019 by Will Bonnet, MD No   History of pre-eclampsia in prior pregnancy, currently pregnant 04/15/2019 by Rexene Agent, CNM No   Supervision of high risk pregnancy, antepartum 04/14/2019 by Rexene Agent, CNM No   Overview Addendum 10/18/2019 11:24 AM by Homero Fellers, MD    Clinic [facility] Prenatal Labs  Dating LMP, [place] Blood type: O/Negative/-- (10/28 1022)   Genetic Screen NIPS:nml, at least one XY Antibody:Negative (10/28 1022)  Anatomic [place] [facility] nml Rubella: 3.42 (10/28 1022) Varicella:  Immune  GTT Home BS log normal for one week at 26 wks  RPR: Non Reactive (10/28 1022)   Rhogam 08/31/2019 HBsAg: Negative (10/28 1022)   TDaP vaccine  4/12      Flu Shot:declines HIV: Non Reactive (10/28 1022)   Baby Food Breast                 GBS:   Contraception Vasec  (or BTL if CS) Pap: 08/14/2018, NILM/HPV Negative  CBB  no   CS/VBAC no   Support Person Husband               [place] at [place] [facility] MFM reviewed and d/w pt  IOL options discussed       Plan at 37 weeks on May 29 at 0800  PNV, FMV  Delivery by vag delivery, and CS options, discussed w twins  PTL precautions discussed  Barnett Applebaum, MD, [facility], [facility] 11/01/2019  11:33 AM

## 2019-11-01 NOTE — Patient Instructions (Signed)

## 2019-11-04 NOTE — Telephone Encounter (Signed)
Arrange NST at [facility] for this patient (twins) per [facility] M[facility] rec's

## 2019-11-05 ENCOUNTER — Observation Stay
Admission: EM | Admit: 2019-11-05 | Discharge: 2019-11-05 | Disposition: A | Discharge status: Home / Self Care | Payer: 59 | Attending: Certified Nurse Midwife | Admitting: Certified Nurse Midwife

## 2019-11-05 DIAGNOSIS — O30003 Twin pregnancy, unspecified number of placenta and unspecified number of amniotic sacs, third trimester: Secondary | ICD-10-CM | POA: Diagnosis present

## 2019-11-05 DIAGNOSIS — Z3A36 36 weeks gestation of pregnancy: Secondary | ICD-10-CM | POA: Diagnosis not present

## 2019-11-05 DIAGNOSIS — O30043 Twin pregnancy, dichorionic/diamniotic, third trimester: Secondary | ICD-10-CM | POA: Diagnosis not present

## 2019-11-05 LAB — CULTURE, BETA STREP (GROUP B ONLY): Strep Gp B Culture: NEGATIVE

## 2019-11-05 NOTE — Final Progress Note (Signed)
Physician Final Progress Note  Patient ID: Marie Stephenson MRN: [id number] DOB/AGE: [birth year] 29 y.o.  Admit date: 11/05/2019 Admitting provider: Homero Fellers, MD/ Karene Fry Discharge date: 11/05/2019   Admission Diagnoses: Dichorionic diamniotic twins  Discharge Diagnoses:  Dichorionic diamniotic twins Reactive NST  Consults: None  Significant Findings/ Diagnostic Studies:  OB History & Physical   History of Present Illness:  Chief Complaint:  I am here for my non stress test. HPI:  Marie Stephenson is a 29 y.o. G64P1001 female with EDC=12/03/2019 at 89w0dgest[facility].  Her pregnancy has been complicated by the twin gestation, an episode of second trimester bleeding, and a history of preeclampsia with G1. .  She presents to L&D for a NST Her [facility] have been growing well [facility] 5#9oz and Twin B (boy) at 6#2oz. Twin A has been cephalic on the maternal right and Twin B has been transverse. She is scheduled for an IOL on May 29. She reports irregular contractions that she is rating a 3/10 today, although she had some stronger contractions that petered off last night before she feel asleep. She has also concerns for some lower extremity edema. Denies headaches, CP, RUQ pain. Has has some scintillating scotomata at times. Reports her blood pressures at home have been normal. She has gained 55# with her pregnancy. Babies have been active.   Prenatal care [facility]  [facility] Prenatal Labs  Dating LMP, [place] type: O/Negative/-- (10/28 1022)   Genetic Screen NIPS:nml, at least one XY Antibody:Negative (10/28 1022)  Anatomic [place] nml Rubella: 3.42 (10/28 1022) Varicella:  Immune  GTT Home BS log normal for one week at 26 wks  RPR: Non Reactive (10/28 1022)   Rhogam 08/31/2019 HBsAg: Negative  (10/28 1022)   TDaP vaccine  4/12      Flu Shot:declines HIV: Non Reactive (10/28 1022)   Baby Food Breast                GBS: negative  Contraception Vasec  (or BTL if CS) Pap: 08/14/2018, NILM/HPV Negative  CBB  no   CS/VBAC no   Support Person Husband            Maternal Medical History:   Past Medical History:  Diagnosis Date  . BRCA negative 08/2018   MyRisk neg except CDK4 VUS  . Family history of breast cancer 08/2018   IBIS=11%/riskscore=8.4%  . Heart murmur    since birth  . Human papilloma virus     Past Surgical History:  Procedure Laterality Date  . WISDOM TOOTH EXTRACTION      No Known Allergies  Prior to Admission medications   Medication Sig Start Date End Date Taking? Authorizing Provider  aspirin 81 MG chewable tablet Chew by mouth daily.    [provider]  folic acid (FOLVITE) 1 MG tablet Take 1 tablet (1 mg total) by mouth daily. 04/21/19   GDalia Heading CNM  Prenatal Vit-Fe Fumarate-FA (MULTIVITAMIN-PRENATAL) 27-0.8 MG TABS tablet Take 1 tablet by mouth daily at 12 noon.    [provider]  UNABLE TO FIND Take 1 capsule by mouth daily. PRENATAL DHA WITH OMEGA 3 400MG    [provider]  UNABLE TO FIND 2 tablets. Blood Builder    [provider]      Social History:  She  reports that she has never smoked. She has never used smokeless tobacco. She reports that she does not use drugs.  Family History: family history includes Breast cancer (age of onset: 39) in her maternal grandmother; Dementia in her maternal grandmother; Endometriosis in her mother; Hypertension in her maternal grandfather and mother; Leukemia in her maternal grandfather.   Review of Systems: see pertinent positives and negatives in the HPI    Physical Exam:  Vital Signs: BP 115/74   Pulse (!) 105   Temp 98.2 F (36.8 C) (Oral)   Resp 14   LMP 02/26/2019 (Exact Date)  General: no acute distress.  HEENT: normocephalic,  atraumatic Heart: mild tachcardia Lungs: normal respiratory effort Abdomen: gravid, NT Extremities: non-tender, symmetric, trace edema bilaterally.   Neurologic: Alert & oriented x 3.    FHR Twin A: Baseline 150 with accelerations to 180s to 200, moderate variability, no decelerations TwinB: Baseline 145 with accelerations to 160s to 170s, moderate variability, no decelerations  Assessment:  Marie Stephenson is a 29 y.o. G2P1001 female at 67w0dwith twin gestation NST reactive x 2  Plan: Discharge home with labor and preeclampsia precautions [facility]  Procedures: NST  Discharge Condition: stable  Disposition: Discharge disposition: 01-Home or Self Care       Diet: Regular diet  Discharge Activity: Activity as tolerated  Discharge Instructions    Discharge patient   Complete by: As directed    Discharge disposition: 01-Home or Self Care   Discharge patient date: 11/05/2019     Allergies as of 11/05/2019   No Known Allergies     Medication List    TAKE these medications   aspirin 81 MG chewable tablet Chew by mouth daily.   folic acid 1 MG tablet Commonly known as: FOLVITE Take 1 tablet (1 mg total) by mouth daily.   multivitamin-prenatal 27-0.8 MG Tabs tablet Take 1 tablet by mouth daily at 12 noon.   UNABLE TO FIND Take 1 capsule by mouth daily. PRENATAL DHA WITH OMEGA 3 400MG   UNABLE TO FIND 2 tablets. Blood Builder        Total time spent taking care of this patient: 15 minutes  Signed: CDalia Heading5/21/2021, 2:03 PM

## 2019-11-05 NOTE — OB Triage Note (Signed)
Pt is a 29yo G2P1 at [redacted]w[redacted]d that presents from the ED for scheduled NST. Pt states she has occasional ctx but that is not unusual for her. Pt denies VB,Lof and states positive FM. Pt states she has had occasional "black spots in my vision and some swelling in my feet." Pt states she had preeclampsia with her first pregnancy. BP 120/69 and EFM applied.

## 2019-11-05 NOTE — OB Triage Note (Signed)
Provider reviewed NST results and discharge orders given. RN reviewed discharge instructions and patient has no further questions at this time. Pt to return to L&D on 11/11/19 for scheduled NST and Covid Swab for scheduled IOL on 11/13/19. Pt discharged home in stable condition with significant other.

## 2019-11-05 NOTE — Discharge Instructions (Signed)
LLABOR: When contractions begin, you should start to time them from the beginning of one contraction to the beginning of the next.  When contractions are 5 minutes apart or less and have been regular for at least an hour, you should call your health care provider.  Notify your doctor if any of the following occur: 1. Bleeding from the vagina 5. Sudden, constant, or occasional abdominal pain  2. Pain or burning when urinating 6. Sudden gushing of fluid from the vagina (with or without continued leaking)  3. Chills or fever 7. Fainting spells, "black outs" or loss of consciousness   8. Severe or continued nausea or vomiting   9. Blurring of vision or spots before the eyes  4. Babies moving less than usual 10. Leaking of fluid    FETAL KICK COUNT: Lie on your left side for one hour after a meal, and count the number of times your baby kicks. If it is less than 4 times, get up, move around and drink some juice. Repeat the test 30 minutes later. If it is still less than 4 kicks in an hour, notify your doctor.

## 2019-11-05 NOTE — Telephone Encounter (Signed)
Spoke w/L&D advised of need to schedule NST. Patient information given. Advised patient can come at anytime today.

## 2019-11-08 ENCOUNTER — Ambulatory Visit (INDEPENDENT_AMBULATORY_CARE_PROVIDER_SITE_OTHER): Payer: 59

## 2019-11-08 ENCOUNTER — Other Ambulatory Visit: Payer: Self-pay | Admitting: Obstetrics & Gynecology

## 2019-11-08 ENCOUNTER — Ambulatory Visit (INDEPENDENT_AMBULATORY_CARE_PROVIDER_SITE_OTHER): Payer: 59 | Admitting: Obstetrics & Gynecology

## 2019-11-08 ENCOUNTER — Other Ambulatory Visit: Payer: Self-pay

## 2019-11-08 ENCOUNTER — Encounter: Payer: Self-pay | Admitting: Obstetrics & Gynecology

## 2019-11-08 VITALS — BP 120/80 | Wt 205.0 lb

## 2019-11-08 DIAGNOSIS — Z3A36 36 weeks gestation of pregnancy: Secondary | ICD-10-CM

## 2019-11-08 DIAGNOSIS — O30043 Twin pregnancy, dichorionic/diamniotic, third trimester: Secondary | ICD-10-CM | POA: Diagnosis not present

## 2019-11-08 DIAGNOSIS — O30049 Twin pregnancy, dichorionic/diamniotic, unspecified trimester: Secondary | ICD-10-CM

## 2019-11-08 DIAGNOSIS — O0993 Supervision of high risk pregnancy, unspecified, third trimester: Secondary | ICD-10-CM

## 2019-11-08 LAB — POCT URINALYSIS DIPSTICK OB
Glucose, UA: NEGATIVE
POC,PROTEIN,UA: NEGATIVE

## 2019-11-08 NOTE — Patient Instructions (Signed)

## 2019-11-08 NOTE — Progress Notes (Signed)
History and Physical  Marie Stephenson is a 29 y.o. G2P1001 [redacted]w[redacted]d for Induction of Labor scheduled due to Twins at 37 weeks .   See labor record for pregnancy highlights.  No recent pain, bleeding, ruptured membranes, or other signs of progressing labor.  PMHx: She  has a past medical history of BRCA negative (08/2018), Family history of breast cancer (08/2018), Heart murmur, and Human papilloma virus. Also,  has a past surgical history that includes Wisdom tooth extraction., family history includes Breast cancer (age of onset: 370 in her maternal grandmother; Dementia in her maternal grandmother; Endometriosis in her mother; Hypertension in her maternal grandfather and mother; Leukemia in her maternal grandfather.,  reports that she has never smoked. She has never used smokeless tobacco. She reports that she does not use drugs. She has a current medication list which includes the following prescription(s): aspirin, folic acid, multivitamin-prenatal, UNABLE TO FIND, and UNABLE TO FIND. Also, has No Known Allergies. OB History  Gravida Para Term Preterm AB Living  2 1 1     1   SAB TAB Ectopic Multiple Live Births          1    # Outcome Date GA Lbr Len/2nd Weight Sex Delivery Anes PTL Lv  2 Current           1 Term 01/12/14 353w2d6 lb 4 oz (2.835 kg) M Vag-Spont   LIV    Obstetric Comments  Men[age]al age: [age]  Age 1st Pregnancy: [age]Had IOL with G1 for preeclampsia  Patient denies any other pertinent gynecologic issues.   ROS  Objective: BP 120/80   Wt 205 lb (93 kg)   LMP 02/26/2019 (Exact Date)   BMI 38.73 kg/m  OBGyn Exam  Assessment: Term Pregnancy for Induction of Labor due to Twins at 37 weeks.  Prior NSVD.  Desires vaginal delivery..  Plan: Patient will undergo induction of labor with cervical ripening agents.     Patient has been fully informed of the pros and cons, risks and benefits of continued observation with fetal monitoring versus that of induction of  labor.   She understands that there are uncommon risks to induction, which include but are not limited to : frequent or prolonged uterine contractions, fetal distress, uterine rupture, and lack of successful induction.  These risks include all methods including Pitocin and Misoprostol and Cervadil.  Patient understands that using Misoprostol for labor induction is an "off label" indication although it has been studied extensively for this purpose and is an accepted method of induction.  She also has been informed of the increased risks for Cesarean with induction and should induction not be successful.  Patient consents to the induction plan of management.  Plans to breast feed Plans vasectomy, bilateral tubal ligation for contraception TDaP UTD  [facility] Prenatal Labs  Dating LMP, USKorealood type: O/Negative/-- (10/28 1022)   Genetic Screen NIPS:nml, at least one XY Antibody:Negative (10/28 1022)  Anatomic [place]B nml Rubella: 3.42 (10/28 1022) Varicella:  Immune  GTT Home BS log normal for one week at 26 wks  RPR: Non Reactive (10/28 1022)   Rhogam 08/31/2019 HBsAg: Negative (10/28 1022)   TDaP vaccine  4/12      Flu Shot:declines HIV: Non Reactive (10/28 1022)   Baby Food Breast                GBS:   Contraception Vasec  (or BTL if CS) Pap: 08/14/2018, NILM/HPV Negative  CBB  no   CS/VBAC no   Support Person Husband    Barnett Applebaum, MD, [facility], [facility] 11/08/2019  8:55 AM

## 2019-11-11 ENCOUNTER — Observation Stay (HOSPITAL_BASED_OUTPATIENT_CLINIC_OR_DEPARTMENT_OTHER)
Admission: EM | Admit: 2019-11-11 | Discharge: 2019-11-11 | Disposition: A | Discharge status: Home / Self Care | Payer: 59 | Source: Home / Self Care | Admitting: Advanced Practice Midwife

## 2019-11-11 DIAGNOSIS — Z3A36 36 weeks gestation of pregnancy: Secondary | ICD-10-CM | POA: Insufficient documentation

## 2019-11-11 DIAGNOSIS — O30043 Twin pregnancy, dichorionic/diamniotic, third trimester: Secondary | ICD-10-CM | POA: Diagnosis present

## 2019-11-11 DIAGNOSIS — Z8249 Family history of ischemic heart disease and other diseases of the circulatory system: Secondary | ICD-10-CM | POA: Insufficient documentation

## 2019-11-11 DIAGNOSIS — Z7982 Long term (current) use of aspirin: Secondary | ICD-10-CM | POA: Insufficient documentation

## 2019-11-11 DIAGNOSIS — Z20822 Contact with and (suspected) exposure to covid-19: Secondary | ICD-10-CM | POA: Insufficient documentation

## 2019-11-11 LAB — SARS CORONAVIRUS 2 BY RT PCR (HOSPITAL ORDER, PERFORMED IN ~~LOC~~ HOSPITAL LAB): SARS Coronavirus 2: NEGATIVE

## 2019-11-11 NOTE — Progress Notes (Signed)
Pt presents to L&D for scheduled NST. VS obtained and WNL. NST reactive and appropriate for gestational age. J.Gledhill, CNM reviewed strip. Covid swab sent for pt's IOL scheduled for 11/13/2019 at midnight. Pt d/c home per CNM order. AVS and discharge instructions given. Labor and bleeding precautions explained to pt. All questions answered at this time. Pt stable and d/c home with significant other at bedside.

## 2019-11-11 NOTE — Discharge Summary (Signed)
Physician Final Progress Note  Patient ID: Marie Stephenson MRN: [id number] DOB/AGE: Jul 25, 1990 29 y.o.  Admit date: 11/11/2019 Admitting provider: Rod Can, CNM Discharge date: 11/11/2019   Admission Diagnoses: Twin pregnancy, dichorionic/diamniotic, third trimester  Discharge Diagnoses:  Active Problems:   Twin pregnancy, dichorionic/diamniotic, third trimester   [redacted] weeks gestation of pregnancy Reactive NST x2  History of Present Illness: The patient is a 29 y.o. female G2P1001 at 95w6dwith DPerrywho presents for NST. She is scheduled for induction of labor in 2 days. She reports good fetal movement x2 and denies contractions, leakage of fluid and vaginal bleeding.   She was admitted for observation and placed on monitors. Pre-procedure Covid swab done. Past Medical History:  Diagnosis Date  . BRCA negative 08/2018   MyRisk neg except CDK4 VUS  . Family history of breast cancer 08/2018   IBIS=11%/riskscore=8.4%  . Heart murmur    since birth  . Human papilloma virus     Past Surgical History:  Procedure Laterality Date  . WISDOM TOOTH EXTRACTION      No current facility-administered medications on file prior to encounter.   Current Outpatient Medications on File Prior to Encounter  Medication Sig Dispense Refill  . aspirin 81 MG chewable tablet Chew by mouth daily.    . folic acid (FOLVITE) 1 MG tablet Take 1 tablet (1 mg total) by mouth daily. 30 tablet 3  . Prenatal Vit-Fe Fumarate-FA (MULTIVITAMIN-PRENATAL) 27-0.8 MG TABS tablet Take 1 tablet by mouth daily at 12 noon.    .Marland KitchenUNABLE TO FIND Take 1 capsule by mouth daily. PRENATAL DHA WITH OMEGA 3 400MG    . UNABLE TO FIND 2 tablets. Blood Builder      No Known Allergies  Social History   Socioeconomic History  . Marital status: Married    Spouse name: TDorothea Ogle . Number of children: 1  . Years of education: 15 . Highest education level: Not on file  Occupational History  . 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 Smoking status: Never Smoker  . Smokeless tobacco: Never Used  Substance and Sexual Activity  . Alcohol use: Not on file  . Drug use: No  . Sexual activity: Not Currently    Birth control/protection: Surgical    Comment: Pelvic Rest  Other Topics Concern  . Not on file  Social History Narrative  . Not on file   Social Determinants of Health   Financial Resource Strain:   . Difficulty of Paying Living Expenses:   Food Insecurity:   . Worried About RCharity fundraiserin the Last Year:   . RArboriculturistin the Last Year:   Transportation Needs:   . LFilm/video editor(Medical):   .Marland KitchenLack of Transportation (Non-Medical):   Physical Activity:   . Days of Exercise per Week:   . Minutes of Exercise per Session:   Stress:   . Feeling of Stress :   Social Connections:   . Frequency of Communication with Friends and Family:   . Frequency of Social Gatherings with Friends and Family:   . Attends Religious Services:   . Active Member of Clubs or Organizations:   . Attends CArchivistMeetings:   .Marland KitchenMarital Status:   Intimate Partner Violence:   . Fear of Current or Ex-Partner:   . Emotionally Abused:   .Marland KitchenPhysically Abused:   . Sexually Abused:  Family History  Problem Relation Age of Onset  . Endometriosis Mother   . Hypertension Mother   . Breast cancer Maternal Grandmother 30       great grandmother  . Dementia Maternal Grandmother   . Leukemia Maternal Grandfather   . Hypertension Maternal Grandfather      Review of Systems  Constitutional: Negative for chills and fever.  HENT: Negative for congestion, ear discharge, ear pain, hearing loss, sinus pain and sore throat.   Eyes: Negative for blurred vision and double vision.  Respiratory: Negative for cough, shortness of breath and wheezing.   Cardiovascular: Negative for chest pain, palpitations and leg swelling.  Gastrointestinal:  Negative for abdominal pain, blood in stool, constipation, diarrhea, heartburn, melena, nausea and vomiting.  Genitourinary: Negative for dysuria, flank pain, frequency, hematuria and urgency.  Musculoskeletal: Negative for back pain, joint pain and myalgias.  Skin: Negative for itching and rash.  Neurological: Negative for dizziness, tingling, tremors, sensory change, speech change, focal weakness, seizures, loss of consciousness, weakness and headaches.  Endo/Heme/Allergies: Negative for environmental allergies. Does not bruise/bleed easily.  Psychiatric/Behavioral: Negative for depression, hallucinations, memory loss, substance abuse and suicidal ideas. The patient is not nervous/anxious and does not have insomnia.      Physical Exam: BP 130/85 (BP Location: Left Arm)   Pulse (!) 106   Temp 98.3 F (36.8 C) (Oral)   Resp 16   LMP 02/26/2019 (Exact Date)   Constitutional: Well nourished, well developed female in no acute distress.  HEENT: normal Skin: Warm and dry.  Cardiovascular: Regular rate and rhythm.   Extremity: trace edema  Respiratory: Clear to auscultation bilateral. Normal respiratory effort Abdomen: FHT present Neuro: DTRs 2+, Cranial nerves grossly intact Psych: Alert and Oriented x3. No memory deficits. Normal mood and affect.  MS: normal gait, normal bilateral lower extremity ROM/strength/stability.  Pelvic exam: deferred  Toco: uterine irritability, mild to palpation Fetal Well Being: Fetus A: 150 bpm baseline, moderate variability, +accelerations, -decelerations Fetus B: 145 bpm baseline, moderate variability, +accelerations, -decelerations  Consults: None  Significant Findings/ Diagnostic Studies:  Covid swab done, results pending  Procedures: NST  Hospital Course: The patient was admitted to Labor and Delivery Triage for observation.   Discharge Condition: good  Disposition: Discharge disposition: 01-Home or Self Care  Diet: Regular  diet  Discharge Activity: Activity as tolerated  Discharge Instructions    Discharge activity:  No Restrictions   Complete by: As directed    Discharge diet:  No restrictions   Complete by: As directed    LABOR:  When conractions begin, you should start to time them from the beginning of one contraction to the beginning  of the next.  When contractions are 5 - 10 minutes apart or less and have been regular for at least an hour, you should call your health care provider.   Complete by: As directed    No sexual activity restrictions   Complete by: As directed    Notify physician for bleeding from the vagina   Complete by: As directed    Notify physician for blurring of vision or spots before the eyes   Complete by: As directed    Notify physician for chills or fever   Complete by: As directed    Notify physician for fainting spells, "black outs" or loss of consciousness   Complete by: As directed    Notify physician for increase in vaginal discharge   Complete by: As directed    Notify physician for  leaking of fluid   Complete by: As directed    Notify physician for pain or burning when urinating   Complete by: As directed    Notify physician for pelvic pressure (sudden increase)   Complete by: As directed    Notify physician for severe or continued nausea or vomiting   Complete by: As directed    Notify physician for sudden gushing of fluid from the vagina (with or without continued leaking)   Complete by: As directed    Notify physician for sudden, constant, or occasional abdominal pain   Complete by: As directed    Notify physician if baby moving less than usual   Complete by: As directed      Allergies as of 11/11/2019   No Known Allergies     Medication List    STOP taking these medications   aspirin 81 MG chewable tablet     TAKE these medications   folic acid 1 MG tablet Commonly known as: FOLVITE Take 1 tablet (1 mg total) by mouth daily.    multivitamin-prenatal 27-0.8 MG Tabs tablet Take 1 tablet by mouth daily at 12 noon.   UNABLE TO FIND Take 1 capsule by mouth daily. PRENATAL DHA WITH OMEGA 3 400MG   UNABLE TO FIND 2 tablets. Blood Builder        Total time spent taking care of this patient: 20 minutes  Signed: Rod Can, CNM  11/11/2019, 10:19 AM

## 2019-11-13 ENCOUNTER — Other Ambulatory Visit: Payer: Self-pay

## 2019-11-13 ENCOUNTER — Inpatient Hospital Stay
Admission: EM | Admit: 2019-11-13 | Discharge: 2019-11-15 | DRG: 806 | Disposition: A | Discharge status: Home / Self Care | Payer: 59 | Attending: Obstetrics & Gynecology | Admitting: Obstetrics & Gynecology

## 2019-11-13 ENCOUNTER — Inpatient Hospital Stay: Payer: 59 | Admitting: Certified Registered"

## 2019-11-13 ENCOUNTER — Inpatient Hospital Stay: Payer: 59 | Admitting: Anesthesiology

## 2019-11-13 ENCOUNTER — Encounter: Payer: Self-pay | Admitting: Obstetrics & Gynecology

## 2019-11-13 DIAGNOSIS — O30009 Twin pregnancy, unspecified number of placenta and unspecified number of amniotic sacs, unspecified trimester: Secondary | ICD-10-CM | POA: Diagnosis present

## 2019-11-13 DIAGNOSIS — O30043 Twin pregnancy, dichorionic/diamniotic, third trimester: Secondary | ICD-10-CM

## 2019-11-13 DIAGNOSIS — O30049 Twin pregnancy, dichorionic/diamniotic, unspecified trimester: Secondary | ICD-10-CM | POA: Diagnosis present

## 2019-11-13 DIAGNOSIS — Z6791 Unspecified blood type, Rh negative: Secondary | ICD-10-CM | POA: Diagnosis not present

## 2019-11-13 DIAGNOSIS — O26893 Other specified pregnancy related conditions, third trimester: Secondary | ICD-10-CM | POA: Diagnosis present

## 2019-11-13 DIAGNOSIS — Z3A37 37 weeks gestation of pregnancy: Secondary | ICD-10-CM | POA: Diagnosis not present

## 2019-11-13 DIAGNOSIS — D62 Acute posthemorrhagic anemia: Secondary | ICD-10-CM | POA: Diagnosis not present

## 2019-11-13 DIAGNOSIS — Z20822 Contact with and (suspected) exposure to covid-19: Secondary | ICD-10-CM | POA: Diagnosis present

## 2019-11-13 DIAGNOSIS — O360130 Maternal care for anti-D [Rh] antibodies, third trimester, not applicable or unspecified: Secondary | ICD-10-CM

## 2019-11-13 DIAGNOSIS — O9081 Anemia of the puerperium: Secondary | ICD-10-CM | POA: Diagnosis not present

## 2019-11-13 LAB — CBC
HCT: 32.9 % — ABNORMAL LOW (ref 36.0–46.0)
Hemoglobin: 11 g/dL — ABNORMAL LOW (ref 12.0–15.0)
MCH: 30.6 pg (ref 26.0–34.0)
MCHC: 33.4 g/dL (ref 30.0–36.0)
MCV: 91.4 fL (ref 80.0–100.0)
Platelets: 166 10*3/uL (ref 150–400)
RBC: 3.6 MIL/uL — ABNORMAL LOW (ref 3.87–5.11)
RDW: 14.6 % (ref 11.5–15.5)
WBC: 8.3 10*3/uL (ref 4.0–10.5)
nRBC: 0 % (ref 0.0–0.2)

## 2019-11-13 MED ORDER — LIDOCAINE HCL (PF) 1 % IJ SOLN: 30.0000 mL | INTRAMUSCULAR | Status: DC | PRN

## 2019-11-13 MED ORDER — LACTATED RINGERS IV SOLN
500.0000 mL | Freq: Once | INTRAVENOUS | Status: AC
  Administered 2019-11-13: 500 mL via INTRAVENOUS

## 2019-11-13 MED ORDER — EPHEDRINE 5 MG/ML INJ
10.0000 mg | INTRAVENOUS | Status: DC | PRN
  Filled 2019-11-13: qty 2

## 2019-11-13 MED ORDER — MISOPROSTOL 200 MCG PO TABS
ORAL_TABLET | ORAL | Status: AC
  Filled 2019-11-13: qty 4

## 2019-11-13 MED ORDER — LIDOCAINE-EPINEPHRINE (PF) 1.5 %-1:200000 IJ SOLN
INTRAMUSCULAR | Status: DC | PRN
  Administered 2019-11-13: 3 mL via EPIDURAL

## 2019-11-13 MED ORDER — LIDOCAINE HCL (PF) 1 % IJ SOLN
INTRAMUSCULAR | Status: DC | PRN
  Administered 2019-11-13: 1.2 mL via SUBCUTANEOUS

## 2019-11-13 MED ORDER — MISOPROSTOL 100 MCG PO TABS
25.0000 ug | ORAL_TABLET | ORAL | Status: DC | PRN
  Administered 2019-11-13 (×2): 25 ug via VAGINAL
  Filled 2019-11-13 (×3): qty 1

## 2019-11-13 MED ORDER — LIDOCAINE HCL (PF) 1 % IJ SOLN
INTRAMUSCULAR | Status: AC
  Filled 2019-11-13: qty 30

## 2019-11-13 MED ORDER — ONDANSETRON HCL 4 MG/2ML IJ SOLN
4.0000 mg | Freq: Four times a day (QID) | INTRAMUSCULAR | Status: DC | PRN
  Administered 2019-11-13 (×2): 4 mg via INTRAVENOUS
  Filled 2019-11-13 (×2): qty 2

## 2019-11-13 MED ORDER — LACTATED RINGERS IV SOLN: 500.0000 mL | INTRAVENOUS | Status: DC | PRN

## 2019-11-13 MED ORDER — FENTANYL 2.5 MCG/ML W/ROPIVACAINE 0.15% IN NS 100 ML EPIDURAL (ARMC)
EPIDURAL | Status: DC | PRN
  Administered 2019-11-13: 12 mL/h via EPIDURAL

## 2019-11-13 MED ORDER — FENTANYL 2.5 MCG/ML W/ROPIVACAINE 0.15% IN NS 100 ML EPIDURAL (ARMC)
12.0000 mL/h | EPIDURAL | Status: DC
  Administered 2019-11-13: 12 mL/h via EPIDURAL
  Filled 2019-11-13: qty 100

## 2019-11-13 MED ORDER — PHENYLEPHRINE 40 MCG/ML (10ML) SYRINGE FOR IV PUSH (FOR BLOOD PRESSURE SUPPORT)
80.0000 ug | PREFILLED_SYRINGE | INTRAVENOUS | Status: DC | PRN
  Filled 2019-11-13: qty 10

## 2019-11-13 MED ORDER — DIPHENHYDRAMINE HCL 50 MG/ML IJ SOLN: 50.0000 mg | Freq: Once | INTRAMUSCULAR | Status: DC

## 2019-11-13 MED ORDER — OXYTOCIN 10 UNIT/ML IJ SOLN
INTRAMUSCULAR | Status: AC
  Filled 2019-11-13: qty 2

## 2019-11-13 MED ORDER — OXYTOCIN BOLUS FROM INFUSION
500.0000 mL | Freq: Once | INTRAVENOUS | Status: AC
  Administered 2019-11-13: 500 mL via INTRAVENOUS

## 2019-11-13 MED ORDER — ACETAMINOPHEN 325 MG PO TABS: 650.0000 mg | ORAL_TABLET | ORAL | Status: DC | PRN

## 2019-11-13 MED ORDER — OXYTOCIN 40 UNITS IN NORMAL SALINE INFUSION - SIMPLE MED
2.5000 [IU]/h | INTRAVENOUS | Status: DC
  Administered 2019-11-13: 2.5 [IU]/h via INTRAVENOUS
  Filled 2019-11-13 (×2): qty 1000

## 2019-11-13 MED ORDER — BUTORPHANOL TARTRATE 1 MG/ML IJ SOLN: 1.0000 mg | INTRAMUSCULAR | Status: DC | PRN

## 2019-11-13 MED ORDER — AMMONIA AROMATIC IN INHA
RESPIRATORY_TRACT | Status: AC
  Filled 2019-11-13: qty 10

## 2019-11-13 MED ORDER — TERBUTALINE SULFATE 1 MG/ML IJ SOLN: 0.2500 mg | Freq: Once | INTRAMUSCULAR | Status: DC | PRN

## 2019-11-13 MED ORDER — LACTATED RINGERS IV SOLN: INTRAVENOUS | Status: DC

## 2019-11-13 MED ORDER — DIPHENHYDRAMINE HCL 50 MG/ML IJ SOLN: 12.5000 mg | INTRAMUSCULAR | Status: DC | PRN

## 2019-11-13 MED ORDER — OXYTOCIN 40 UNITS IN NORMAL SALINE INFUSION - SIMPLE MED
1.0000 m[IU]/min | INTRAVENOUS | Status: DC
  Administered 2019-11-13: 1 m[IU]/min via INTRAVENOUS
  Administered 2019-11-13: 3 m[IU]/min via INTRAVENOUS

## 2019-11-13 MED ORDER — CALCIUM CARBONATE ANTACID 500 MG PO CHEW: 2.0000 | CHEWABLE_TABLET | Freq: Once | ORAL | Status: DC

## 2019-11-13 MED ORDER — FENTANYL 2.5 MCG/ML W/ROPIVACAINE 0.15% IN NS 100 ML EPIDURAL (ARMC)
EPIDURAL | Status: AC
  Filled 2019-11-13: qty 100

## 2019-11-13 NOTE — Anesthesia Postprocedure Evaluation (Signed)
Anesthesia Post Note  Patient: Marie Stephenson  Procedure(s) Performed: AN AD [place]  Patient location during evaluation: L&D Anesthesia Type: Epidural Level of consciousness: awake and alert Pain management: pain level controlled Vital Signs Assessment: post-procedure vital signs reviewed and stable Respiratory status: spontaneous breathing and nonlabored ventilation Cardiovascular status: stable Postop Assessment: no apparent nausea or vomiting Anesthetic complications: no     Last Vitals:  Vitals:   11/13/19 1757 11/13/19 1908  BP: (!) 102/44 (!) 104/49  Pulse: (!) 101 90  Resp: 17 18  Temp: 37.2 C 36.9 C    Last Pain:  Vitals:   11/13/19 1908  TempSrc: Oral  PainSc: 7                  Glennon Kopko K

## 2019-11-13 NOTE — Progress Notes (Signed)
  Labor Progress Note   29 y.o. G2P1001 @ [redacted]w[redacted]d , admitted for  Pregnancy, Labor Management.   Subjective:  No pain  Objective:  BP (!) 98/47   Pulse 83   Temp 98 F (36.7 C) (Oral)   Resp 17   Ht 5\' 1"  (1.549 m)   Wt 93 kg   LMP 02/26/2019 (Exact Date)   BMI 38.73 kg/m  Abd: gravid, ND, FHT present, mild tenderness on exam Extr: trace to 1+ bilateral pedal edema SVE: CERVIX: 4 cm dilated, 80 effaced, -2 station IUPC placed  EFM: FHR: 130/140 bpm, variability: moderate,  accelerations:  Present,  decelerations:  Absent Toco: Frequency: Every 2-4 minutes Labs: I have reviewed the patient's lab results.   Assessment & Plan:  G2P1001 @ [redacted]w[redacted]d, admitted for  Pregnancy and Labor/Delivery Management  1. Pain management: epidural. 2. FWB: FHT category 1.  3. ID: GBS negative 4. Labor management: IUPC to better titrate Pitocin (current rate 11 mU/min)  All discussed with patient, see orders  Barnett Applebaum, MD, [facility], [facility] 11/13/2019  2:57 PM

## 2019-11-13 NOTE — Transfer of Care (Signed)
Immediate Anesthesia Transfer of Care Note  Patient: Marie Stephenson  Procedure(s) Performed: AN AD [place] CRNA MONITORING  Patient Location: PACU and Mother/Baby  Anesthesia Type:Epidural  Level of Consciousness: awake  Airway & Oxygen Therapy: Patient Spontanous Breathing  Post-op Assessment: Report given to RN  Post vital signs: Reviewed and stable  Last Vitals:  Vitals Value Taken Time  BP    Temp    Pulse    Resp    SpO2      Last Pain:  Vitals:   11/13/19 1908  TempSrc: Oral  PainSc: 7       Patients Stated Pain Goal: 0 ([phone] [id number])  Complications: No apparent anesthesia complications

## 2019-11-13 NOTE — H&P (Signed)
History and Physical Interval Note:  11/13/2019 8:21 AM  Marie Stephenson  has presented today for INDUCTION OF LABOR (cervical ripening agents),  with the diagnosis of twin pregnancy at 37+ weeks EGA. The various methods of treatment have been discussed with the patient and family. After consideration of risks, benefits and other options for treatment, the patient has consented to  Labor induction .  The patient's history has been reviewed, patient examined, no change in status, and is stable for induction as planned.  See H&P. I have reviewed the patient's chart and labs.  Questions were answered to the patient's satisfaction.    Plans to breast feed Plans vasectomy, bilateral tubal ligation for contraception TDaP UTD  [facility] Prenatal Labs  Dating LMP, [place] Blood type: O/Negative/-- (10/28 1022)   Genetic Screen NIPS:nml, at least one XY Antibody:Negative (10/28 1022)  Anatomic [place] WSOB nml Rubella: 3.42 (10/28 1022) Varicella:  Immune  GTT Home BS log normal for one week at 26 wks  RPR: Non Reactive (10/28 1022)   Rhogam 08/31/2019 HBsAg: Negative (10/28 1022)   TDaP vaccine  4/12      Flu Shot:declines HIV: Non Reactive (10/28 1022)   Baby Food Breast                GBS:   Contraception Vasec  (or BTL if CS) Pap: 08/14/2018, NILM/HPV Negative  CBB  no   CS/VBAC no   Support Person Husband    ULTRASOUND REPORT  Location: Bedside ultrasound now Date of Service: 11/13/2019   Indications:Twins Findings:  Twin intrauterine pregnancy is visualized with FHR at 130s, 130s BPM.  Fetal presentation is VTX twin A, VTX twin B.  Placenta: post and ant AFI: both have >2 cm poclets  Impression: 1. [redacted]w[redacted]d Viable TWIN Intrauterine pregnancy dated by previously established criteria. 2. AFI is normal   Recommendations: 1.Clinical correlation with the patient's History and Physical Exam. 2. Induction of labor in progress  Interval Note-  Pt admitted and has had 2 dose of  Cytotec. Cervix has changed since admission,    Current SVE: 1-2/70/-3, Vtx    Bloody show after exam A NST procedure was performed with continuous fetal monitoring of both twins and revealing normal baseline established, appropriate time of 20-40 minutes of evaluation, and accels >2 seen w 15x15 characteristics.  Results show a REACTIVE NST.   Counseled as to the pros and cons of twin vaginal delivery, CS possibilities and risks, double set-up for delivery.  Barnett Applebaum, MD, [facility], [facility] 11/13/2019  8:21 AM

## 2019-11-13 NOTE — Progress Notes (Signed)
  Labor Progress Note   29 y.o. G2P1001 @ [redacted]w[redacted]d , admitted for  Pregnancy, Labor Management.   Subjective:  Epidural providing relief  Objective:  BP 117/64   Pulse (!) 118   Temp 98 F (36.7 C) (Oral)   Resp 17   Ht 5\' 1"  (1.549 m)   Wt 93 kg   LMP 02/26/2019 (Exact Date)   BMI 38.73 kg/m  Abd: gravid, ND, FHT present, mild tenderness on exam Extr: trace to 1+ bilateral pedal edema SVE: CERVIX: 3 cm dilated, 80 effaced, -2 station AROM clear  EFM: FHR: 150/140 bpm, variability: moderate,  accelerations:  Present,  decelerations:  Absent Toco: Frequency: Every 3-4 minutes Labs: I have reviewed the patient's lab results.   Assessment & Plan:  G2P1001 @ [redacted]w[redacted]d, admitted for  Pregnancy and Labor/Delivery Management  1. Pain management: epidural. 2. FWB: FHT category 1.  3. ID: GBS negative 4. Labor management: Pitocin 3 mU/min AROM done  All discussed with patient, see orders  Barnett Applebaum, MD, [facility], [facility] 11/13/2019  12:10 PM

## 2019-11-13 NOTE — Anesthesia Procedure Notes (Signed)
Epidural Patient location during procedure: OB Start time: 11/13/2019 10:45 AM End time: 11/13/2019 11:17 AM  Staffing Performed: anesthesiologist   Preanesthetic Checklist Completed: patient identified, IV checked, site marked, risks and benefits discussed, surgical consent, monitors and equipment checked, pre-op evaluation and timeout performed  Epidural Patient position: sitting Prep: Betadine Patient monitoring: heart rate, continuous pulse ox and blood pressure Approach: midline Location: L4-L5 Injection technique: LOR saline  Needle:  Needle type: Tuohy  Needle gauge: 17 G Needle length: 9 cm and 9 Needle insertion depth: 8 cm Catheter type: closed end flexible Catheter size: 19 Gauge Catheter at skin depth: 13 cm Test dose: negative and 1.5% lidocaine with Epi 1:200 K  Assessment Events: blood not aspirated, injection not painful, no injection resistance, no paresthesia and negative IV test  Additional Notes   Patient tolerated the insertion well without complications.Reason for block:procedure for pain

## 2019-11-13 NOTE — Anesthesia Preprocedure Evaluation (Signed)
Anesthesia Evaluation  Patient identified by MRN, date of birth, ID band Patient awake    Reviewed: Allergy & Precautions, NPO status , Patient's Chart, lab work & pertinent test results  History of Anesthesia Complications Negative for: history of anesthetic complications  Airway Mallampati: III       Dental   Pulmonary neg sleep apnea, neg COPD,           Cardiovascular (-) hypertension(-) Past MI and (-) CHF (-) dysrhythmias + Valvular Problems/Murmurs (murmur, no tx)      Neuro/Psych neg Seizures    GI/Hepatic Neg liver ROS, GERD (gestational)  ,  Endo/Other  neg diabetes  Renal/GU negative Renal ROS     Musculoskeletal   Abdominal   Peds  Hematology   Anesthesia Other Findings   Reproductive/Obstetrics (+) Pregnancy (twin gestation)                             Anesthesia Physical  Anesthesia Plan  ASA: II  Anesthesia Plan: Epidural   Post-op Pain Management:    Induction:   PONV Risk Score and Plan:   Airway Management Planned:   Additional Equipment:   Intra-op Plan:   Post-operative Plan:   Informed Consent: I have reviewed the patients History and Physical, chart, labs and discussed the procedure including the risks, benefits and alternatives for the proposed anesthesia with the patient or authorized representative who has indicated his/her understanding and acceptance.       Plan Discussed with:   Anesthesia Plan Comments:         Anesthesia Quick Evaluation

## 2019-11-13 NOTE — Progress Notes (Signed)
  Labor Progress Note   29 y.o. G2P1001 @ [redacted]w[redacted]d , admitted for  Pregnancy, Labor Management.   Subjective:  Pt 9+ and feels ctx pains  Objective:  BP (!) 104/49 (BP Location: Right Arm)   Pulse 90   Temp 98.4 F (36.9 C) (Oral)   Resp 18   Ht 5\' 1"  (1.549 m)   Wt 93 kg   LMP 02/26/2019 (Exact Date)   BMI 38.73 kg/m  Abd: gravid, ND, FHT present, moderate tenderness on exam Extr: trace to 1+ bilateral pedal edema SVE: CERVIX: 9+ cm dilated  EFM: FHR: 130/140 bpm, variability: moderate,  accelerations:  Present,  decelerations:  Absent Toco: Frequency: Every 2-3 minutes Labs: I have reviewed the patient's lab results.   Assessment & Plan:  G2P1001 @ [redacted]w[redacted]d, admitted for  Pregnancy and Labor/Delivery Management  1. Pain management: epidural. 2. FWB: FHT category 1.  3. ID: GBS negative 4. Labor management: Double set-up for delivery, antciipate second stage soon, team assembled for anes,OR, SCN, delivery  All discussed with patient, see orders  Barnett Applebaum, MD, [facility], [facility] 11/13/2019  7:51 PM

## 2019-11-13 NOTE — Anesthesia Preprocedure Evaluation (Signed)
Anesthesia Evaluation  Patient identified by MRN, date of birth, ID band Patient awake    Reviewed: Allergy & Precautions, NPO status , Patient's Chart, lab work & pertinent test results  History of Anesthesia Complications Negative for: history of anesthetic complications  Airway Mallampati: III       Dental   Pulmonary neg sleep apnea, neg COPD,           Cardiovascular (-) hypertension(-) Past MI and (-) CHF (-) dysrhythmias + Valvular Problems/Murmurs (murmur, no tx)      Neuro/Psych neg Seizures    GI/Hepatic Neg liver ROS, GERD (gestational)  ,  Endo/Other  neg diabetes  Renal/GU negative Renal ROS     Musculoskeletal   Abdominal   Peds  Hematology   Anesthesia Other Findings   Reproductive/Obstetrics (+) Pregnancy (twin gestation)                             Anesthesia Physical Anesthesia Plan  ASA: II  Anesthesia Plan: Epidural   Post-op Pain Management:    Induction:   PONV Risk Score and Plan:   Airway Management Planned:   Additional Equipment:   Intra-op Plan:   Post-operative Plan:   Informed Consent: I have reviewed the patients History and Physical, chart, labs and discussed the procedure including the risks, benefits and alternatives for the proposed anesthesia with the patient or authorized representative who has indicated his/her understanding and acceptance.       Plan Discussed with:   Anesthesia Plan Comments:         Anesthesia Quick Evaluation

## 2019-11-13 NOTE — Discharge Summary (Signed)
Postpartum Discharge Summary  Date of Service updated5/31/2021     Patient Name: Marie Stephenson DOB: [birth year] MRN: [id number]  Date of admission: 11/13/2019 Delivery date:   Ciin, Brazzel [[id number]]  11/13/2019    Reyna, Lorenzi [[id number]]  11/13/2019   Delivering provider:    Raylen, Ken [[id number]]  AIYANNAH, FAYAD, PendingBaby [[id number]]  Gae Dry   Date of discharge: 11/15/2019  Admitting diagnosis: Twin pregnancy [O30.009] Intrauterine pregnancy: [redacted]w[redacted]d    Secondary diagnosis:  Principal Problem:   Dichorionic diamniotic twin pregnancy, antepartum Active Problems:   Rh negative state in antepartum period   Indication for care in labor and delivery, antepartum   Twin pregnancy  Additional problems: none    Discharge diagnosis: Term Pregnancy Delivered and twin pregnancy                                              Post partum procedures:none Augmentation: AROM, Pitocin and Cytotec Complications: None  Hospital course: Induction of Labor With Vaginal Delivery   29y.o. yo G2P1001 at 369w1das admitted to the hospital 11/13/2019 for induction of labor.  Indication for induction: twins.  Patient had an uncomplicated labor course as follows: Membrane Rupture Time/Date:    BeKahlyn, Shippey0[[id number]]12:05 PM    BeBeatris Shipauren [0[[id number]]8:15 PM  ,   BeHopelynn, Gartland0[[id number]]11/13/2019    BeVanesa, Renier0[[id number]]11/13/2019    Delivery Method:   BeHector [place]0[[id number]]Vaginal, Spontaneous    BeBeatris Shipauren [[id number]]Vaginal, Spontaneous   Episiotomy:    BeZacari, Radick0[[id number]]None    BeBeatris Shipauren [0[[id number]]None   Lacerations:     BeIleane, Sando0[[id number]]None    BeCilicia, Bordenauren [0[[id number]]None   Details of delivery can be found in separate delivery note.   Patient had a routine postpartum course. Patient is discharged home 11/15/19.  Newborn Data: Birth date:   BeMysti, Haley0[[id number]]11/13/2019    BeJolan, Mealor0[[id number]]11/13/2019   Birth time:   BeAnniemae, Haberkorn0[[id number]]8:12 PM    BeBeatris Shipauren [0[[id number]]8:26 PM   Gender:   BeNorma, Montemurro0[[id number]]Female    BeDyann, Goodspeedauren [0[[id number]]Female   Living status:   BeDebara, Kamphuis0[[id number]]  SUPJSR    PRXY[place]OPFY[place]auren [0[[id number]]Living   Apgars:   BeThedora, Rings0[[id number]]8 [address] [0[[id number]]8  ,   BeRaenette, Sakata0[[id number]]9 [address] [0[[id number]]9   Weight:   BeShevy, Yaney0[[id number]]2470 g    BeLynzee, Lindquistauren [0[[id number]]2970 g    Magnesium Sulfate received: No BMZ received: Yes Rhophylac:Yes MMR:No T-DaP:Given prenatally Flu: No Transfusion:No  Physical exam  Vitals:   11/14/19 1104 11/14/19 1533 11/14/19 2347 11/15/19 0813  BP: 111/64 109/61 (!) 100/59 110/65  Pulse: 74 62 82 80  Resp: 18 18 18 20   Temp: 98.5 F (36.9 C) 97.9 F (36.6 C) 97.9 F (36.6 C) 98.2 F (36.8 C)  TempSrc: Oral Oral Oral Oral  SpO2: 98% 98% 98% 99%  Weight:      Height:       General: alert, cooperative and no distress Lochia: appropriate Uterine Fundus: firm Incision: N/A DVT Evaluation: No evidence of DVT seen on physical exam. Labs: Lab Results  Component Value Date   WBC 12.3 (H) 11/14/2019   HGB 10.4 (L) 11/14/2019   HCT 29.9 (L) 11/14/2019   MCV 89.5 11/14/2019   PLT 148 (L) 11/14/2019   CMP Latest Ref Rng & Units 08/14/2018  Glucose 65 - 99 mg/dL 73  BUN 6 - 20 mg/dL 15  Creatinine 0.57 - 1.00 mg/dL 0.76  Sodium 134 - 144 mmol/L 139  Potassium 3.5 - 5.2 mmol/L 4.9  Chloride 96 - 106 mmol/L 103  CO2 20 - 29 mmol/L 22  Calcium 8.7 - 10.2 mg/dL 9.0  Total Protein 6.0 - 8.5  g/dL 6.9  Total Bilirubin 0.0 - 1.2 mg/dL 0.4  Alkaline Phos 39 - 117 IU/L 39  AST 0 - 40 IU/L 18  ALT 0 - 32 IU/L 16   Edinburgh Score: Edinburgh Postnatal Depression Scale Screening Tool 11/14/2019  I have been able to laugh and see the funny side of things. 0  I have looked forward with enjoyment to things. 0  I have blamed myself unnecessarily when things went wrong. 1  I have been anxious or worried for no good reason. 2  I have felt scared or panicky for no good reason. 1  Things have been getting on top of me. 0  I have been so unhappy that I have had difficulty sleeping. 0  I have felt sad or miserable. 0  I have been so unhappy that I have been crying. 0  The thought of harming myself has occurred to me. 0  Edinburgh Postnatal Depression Scale Total 4      After visit meds:  Allergies as of 11/15/2019   No Known Allergies     Medication List    TAKE these medications   folic acid 1 MG tablet Commonly known as: FOLVITE Take 1 tablet (1 mg total) by mouth daily.   multivitamin-prenatal 27-0.8 MG Tabs tablet Take 1 tablet by mouth daily at 12 noon.   UNABLE TO FIND Take 1 capsule by mouth daily. PRENATAL DHA WITH OMEGA 3 400MG   UNABLE TO FIND 2 tablets. Blood Builder            Discharge Care Instructions  (From admission, onward)         Start     Ordered   11/15/19 0000  Discharge wound care:    Comments: SHOWER DAILY Wash incision gently with soap and water.  Call office with any drainage, redness, or firmness of the incision.   11/15/19 0914           Discharge home in stable condition Infant Feeding: Breast Infant Disposition:home with mother Discharge instruction: per After Visit Summary and Postpartum booklet. Activity: Advance as tolerated. Pelvic rest for 6 weeks.  Diet: routine diet Anticipated Birth Control: plans vsectomy Postpartum Appointment:2 weeks Additional Postpartum F/U: Postpartum Depression checkup Future  Appointments:No future appointments. Follow up Visit: Follow-up Information    Gae Dry, MD. Schedule an appointment as soon as possible for a visit in 2 week(s).   Specialty: Obstetrics and Gynecology Why: Post Partum Check Contact information: [address] [place] [address] [phone]               11/15/2019 Homero Fellers, MD

## 2019-11-14 LAB — CBC
HCT: 29.9 % — ABNORMAL LOW (ref 36.0–46.0)
Hemoglobin: 10.4 g/dL — ABNORMAL LOW (ref 12.0–15.0)
MCH: 31.1 pg (ref 26.0–34.0)
MCHC: 34.8 g/dL (ref 30.0–36.0)
MCV: 89.5 fL (ref 80.0–100.0)
Platelets: 148 10*3/uL — ABNORMAL LOW (ref 150–400)
RBC: 3.34 MIL/uL — ABNORMAL LOW (ref 3.87–5.11)
RDW: 14.9 % (ref 11.5–15.5)
WBC: 12.3 10*3/uL — ABNORMAL HIGH (ref 4.0–10.5)
nRBC: 0 % (ref 0.0–0.2)

## 2019-11-14 LAB — FETAL SCREEN: Fetal Screen: NEGATIVE

## 2019-11-14 LAB — RPR: RPR Ser Ql: NONREACTIVE

## 2019-11-14 MED ORDER — ACETAMINOPHEN 325 MG PO TABS
650.0000 mg | ORAL_TABLET | ORAL | Status: DC | PRN
  Administered 2019-11-14: 650 mg via ORAL
  Filled 2019-11-14: qty 2

## 2019-11-14 MED ORDER — ONDANSETRON HCL 4 MG PO TABS: 4.0000 mg | ORAL_TABLET | ORAL | Status: DC | PRN

## 2019-11-14 MED ORDER — SENNOSIDES-DOCUSATE SODIUM 8.6-50 MG PO TABS
2.0000 | ORAL_TABLET | ORAL | Status: DC
  Administered 2019-11-14: 2 via ORAL
  Filled 2019-11-14: qty 2

## 2019-11-14 MED ORDER — OXYCODONE-ACETAMINOPHEN 5-325 MG PO TABS: 1.0000 | ORAL_TABLET | ORAL | Status: DC | PRN

## 2019-11-14 MED ORDER — SODIUM CHLORIDE 0.9% FLUSH: 3.0000 mL | INTRAVENOUS | Status: DC | PRN

## 2019-11-14 MED ORDER — COCONUT OIL OIL
1.0000 "application " | TOPICAL_OIL | Status: DC | PRN
  Filled 2019-11-14: qty 120

## 2019-11-14 MED ORDER — FERROUS SULFATE 325 (65 FE) MG PO TABS
325.0000 mg | ORAL_TABLET | Freq: Two times a day (BID) | ORAL | Status: DC
  Administered 2019-11-14 – 2019-11-15 (×3): 325 mg via ORAL
  Filled 2019-11-14 (×3): qty 1

## 2019-11-14 MED ORDER — PRENATAL MULTIVITAMIN CH: 1.0000 | ORAL_TABLET | Freq: Every day | ORAL | Status: DC

## 2019-11-14 MED ORDER — OXYCODONE-ACETAMINOPHEN 5-325 MG PO TABS: 2.0000 | ORAL_TABLET | ORAL | Status: DC | PRN

## 2019-11-14 MED ORDER — RHO D IMMUNE GLOBULIN 1500 UNIT/2ML IJ SOSY
300.0000 ug | PREFILLED_SYRINGE | Freq: Once | INTRAMUSCULAR | Status: AC
  Administered 2019-11-14: 300 ug via INTRAVENOUS
  Filled 2019-11-14: qty 2

## 2019-11-14 MED ORDER — DIBUCAINE (PERIANAL) 1 % EX OINT: 1.0000 "application " | TOPICAL_OINTMENT | CUTANEOUS | Status: DC | PRN

## 2019-11-14 MED ORDER — SIMETHICONE 80 MG PO CHEW: 80.0000 mg | CHEWABLE_TABLET | ORAL | Status: DC | PRN

## 2019-11-14 MED ORDER — SODIUM CHLORIDE 0.9% FLUSH: 3.0000 mL | Freq: Two times a day (BID) | INTRAVENOUS | Status: DC

## 2019-11-14 MED ORDER — DIPHENHYDRAMINE HCL 25 MG PO CAPS: 25.0000 mg | ORAL_CAPSULE | Freq: Four times a day (QID) | ORAL | Status: DC | PRN

## 2019-11-14 MED ORDER — ONDANSETRON HCL 4 MG/2ML IJ SOLN: 4.0000 mg | INTRAMUSCULAR | Status: DC | PRN

## 2019-11-14 MED ORDER — SODIUM CHLORIDE 0.9 % IV SOLN: 250.0000 mL | INTRAVENOUS | Status: DC | PRN

## 2019-11-14 MED ORDER — WITCH HAZEL-GLYCERIN EX PADS: 1.0000 "application " | MEDICATED_PAD | CUTANEOUS | Status: DC | PRN

## 2019-11-14 MED ORDER — BENZOCAINE-MENTHOL 20-0.5 % EX AERO: 1.0000 "application " | INHALATION_SPRAY | CUTANEOUS | Status: DC | PRN

## 2019-11-14 MED ORDER — ZOLPIDEM TARTRATE 5 MG PO TABS: 5.0000 mg | ORAL_TABLET | Freq: Every evening | ORAL | Status: DC | PRN

## 2019-11-14 MED ORDER — IBUPROFEN 600 MG PO TABS
600.0000 mg | ORAL_TABLET | Freq: Four times a day (QID) | ORAL | Status: DC
  Administered 2019-11-14 (×4): 600 mg via ORAL
  Filled 2019-11-14 (×4): qty 1

## 2019-11-14 NOTE — Progress Notes (Signed)
Admit Date: 11/13/2019 Today's Date: 11/14/2019  Post Partum Day 1 - Twin Vag Delivery  Subjective:  no complaints, up ad lib, voiding and tolerating PO  Objective: Temp:  [98 F (36.7 C)-99.1 F (37.3 C)] 98.4 F (36.9 C) (05/30 0735) Pulse Rate:  [77-122] 84 (05/30 0735) Resp:  [17-20] 18 (05/30 0735) BP: (92-130)/(39-80) 110/59 (05/30 0735) SpO2:  [97 %-99 %] 99 % (05/30 0735)  Physical Exam:  General: alert, cooperative and no distress Lochia: appropriate Uterine Fundus: firm, below umb Incision: none DVT Evaluation: No evidence of DVT seen on physical exam. Negative Homan's sign. No cords or calf tenderness. No significant calf/ankle edema.  Recent Labs    11/13/19 0035 11/14/19 0731  HGB 11.0* 10.4*  HCT 32.9* 29.9*    Assessment/Plan: Plan for discharge tomorrow  Breastfeeding going well Plans Vasec for husband PNV, Fe for anemia Infants doing well   LOS: 1 day   [place] 11/14/2019, 9:46 AM

## 2019-11-14 NOTE — Anesthesia Postprocedure Evaluation (Signed)
Anesthesia Post Note  Patient: Marie Stephenson  Procedure(s) Performed: AN AD [place]  Patient location during evaluation: Mother Baby Anesthesia Type: Epidural Level of consciousness: awake and alert Pain management: pain level controlled Vital Signs Assessment: post-procedure vital signs reviewed and stable Respiratory status: spontaneous breathing, nonlabored ventilation and respiratory function stable Cardiovascular status: stable Postop Assessment: no headache, no backache and epidural receding Anesthetic complications: no     Last Vitals:  Vitals:   11/14/19 0735 11/14/19 1104  BP: (!) 110/59 111/64  Pulse: 84 74  Resp: 18 18  Temp: 36.9 C 36.9 C  SpO2: 99% 98%    Last Pain:  Vitals:   11/14/19 1104  TempSrc: Oral  PainSc:                  Akela Pocius K

## 2019-11-15 LAB — RHOGAM INJECTION: Unit division: 0

## 2019-11-15 MED ORDER — IBUPROFEN 600 MG PO TABS
600.0000 mg | ORAL_TABLET | Freq: Four times a day (QID) | ORAL | Status: DC
  Administered 2019-11-15: 600 mg via ORAL
  Filled 2019-11-15: qty 1

## 2019-11-15 NOTE — Discharge Instructions (Signed)
Postpartum Baby Blues The postpartum period begins right after the birth of a baby. During this time, there is often a lot of joy and excitement. It is also a time of many changes in the life of the parents. No matter how many times a mother gives birth, each child brings new challenges to the family, including different ways of relating to one another. It is common to have feelings of excitement along with confusing changes in moods, emotions, and thoughts. You may feel happy one minute and sad or stressed the next. These feelings of sadness usually happen in the period right after you have your baby, and they go away within a week or two. This is called the "baby blues." What are the causes? There is no known cause of baby blues. It is likely caused by a combination of factors. However, changes in hormone levels after childbirth are believed to trigger some of the symptoms. Other factors that can play a role in these mood changes include:  Lack of sleep.  Stressful life events, such as poverty, caring for a loved one, or death of a loved one.  Genetics. What are the signs or symptoms? Symptoms of this condition include:  Brief changes in mood, such as going from extreme happiness to sadness.  Decreased concentration.  Difficulty sleeping.  Crying spells and tearfulness.  Loss of appetite.  Irritability.  Anxiety. If the symptoms of baby blues last for more than 2 weeks or become more severe, you may have postpartum depression. How is this diagnosed? This condition is diagnosed based on an evaluation of your symptoms. There are no medical or lab tests that lead to a diagnosis, but there are various questionnaires that a health care provider may use to identify women with the baby blues or postpartum depression. How is this treated? Treatment is not needed for this condition. The baby blues usually go away on their own in 1-2 weeks. Social support is often all that is needed. You will  be encouraged to get adequate sleep and rest. Follow these instructions at home: Lifestyle      Get as much rest as you can. Take a nap when the baby sleeps.  Exercise regularly as told by your health care provider. Some women find yoga and walking to be helpful.  Eat a balanced and nourishing diet. This includes plenty of fruits and vegetables, whole grains, and lean proteins.  Do little things that you enjoy. Have a cup of tea, take a bubble bath, read your favorite magazine, or listen to your favorite music.  Avoid alcohol.  Ask for help with household chores, cooking, grocery shopping, or running errands. Do not try to do everything yourself. Consider hiring a postpartum doula to help. This is a professional who specializes in providing support to new mothers.  Try not to make any major life changes during pregnancy or right after giving birth. This can add stress. General instructions  Talk to people close to you about how you are feeling. Get support from your partner, family members, friends, or other new moms. You may want to join a support group.  Find ways to cope with stress. This may include: ? Writing your thoughts and feelings in a journal. ? Spending time outside. ? Spending time with people who make you laugh.  Try to stay positive in how you think. Think about the things you are grateful for.  Take over-the-counter and prescription medicines only as told by your health care provider.  Let your health care provider know if you have any concerns.  Keep all postpartum visits as told by your health care provider. This is important. Contact a health care provider if:  Your baby blues do not go away after 2 weeks. Get help right away if:  You have thoughts of taking your own life (suicidal thoughts).  You think you may harm the baby or other people.  You see or hear things that are not there (hallucinations). Summary  After giving birth, you may feel happy  one minute and sad or stressed the next. Feelings of sadness that happen right after the baby is born and go away after a week or two are called the "baby blues."  You can manage the baby blues by getting enough rest, eating a healthy diet, exercising, spending time with supportive people, and finding ways to cope with stress.  If feelings of sadness and stress last longer than 2 weeks or get in the way of caring for your baby, talk to your health care provider. This may mean you have postpartum depression. This information is not intended to replace advice given to you by your health care provider. Make sure you discuss any questions you have with your health care provider. Document Revised: 09/25/2018 Document Reviewed: 07/30/2016 Elsevier Patient Education  [place]. Paced Infant Bottle Feeding Paced bottle feeding is a way to bottle feed your baby that is more like breastfeeding. This type of feeding helps your baby learn to eat more slowly and stop when full. This can help prevent overfeeding and discomfort. Paced bottle feeding may also help your baby feel comfortable with both bottle feeding and breastfeeding. The goal is to provide a bottle feeding that is similar to the pace and flow of breast milk from the breast. This is done by holding the bottle in a way that controls the flow of milk, and by taking periodic breaks during feedings. Paced bottle feeding works well if you want to continue breastfeeding but will sometimes need to bottle feed your baby with pumped breast milk or formula. You may also want to consider this method if:  You are unable to breastfeed.  You want others to feed your baby, such as if you are returning to work. How to plan for paced bottle feeding Before you start bottle feeding, talk to your child's health care provider or a Science writer. Ask what type of formula and bottle would work best for your baby. In general, a 7-ounce bottle with a slow  flow nipple works well. If you are going to pump breast milk, ask how often you should pump, and learn how to pump and store your milk safely. Plan to bottle feed on demand, which means feeding whenever your baby shows signs of being hungry. Your baby may:  Put fingers or a hand into his or her mouth.  Clench fists over the tummy or flex arms and legs.  Turn the head and open the mouth as if looking for a nipple (rooting).  Make sucking noises.  Cry. This is usually a late sign of hunger.  Act fussy or restless. How to prepare the bottle To get the bottle ready for bottle feeding:  Wash your hands.  Make sure the bottle and nipple are clean. ? If you are using the bottle for the first time, sterilize all parts in boiling water for 10 minutes; cool and air-dry. ? After the first use, you can clean all the bottle parts in  hot, soapy water and rinse thoroughly once a day.  If you are using formula, follow the directions for mixing the formula or the instructions from your child's health care provider.  If you are using breast milk, thaw the milk in the refrigerator. Do not use breast milk after it has been thawed or stored in the refrigerator for longer than 24 hours.  You may heat the bottle in warm water. Make sure it is not too hot or too cold. Never use a microwave to warm up a bottle. How to perform paced bottle feeding Follow these steps for paced bottle feeding: 1. Hold your baby close to your body at a slight angle, or semi-upright, as you would for breastfeeding. Your baby's head should be higher than his or her stomach. 2. Support your baby's head in the crook of your arm. 3. Place the nipple on your baby's cheek. Let your baby root around to find the nipple. You may tickle or stroke your baby's lips with the nipple to stimulate rooting. Let your baby draw the bottle nipple into the mouth on his or her own, just like the baby does with breastfeeding. 4. Once your baby latches  on to the nipple, hold the bottle flat, parallel to the floor. This will help your baby control the flow of milk so that it does not come out too fast. 5. Tip the bottle slightly to let about half the nipple fill. Do not tilt the bottle straight up into the air. This will force too much milk or formula into your baby's mouth. 6. After about three to five sucks, tilt the bottle back to flat, wait a few seconds, and tilt back up slightly. Continue tilting and pausing. This allows your baby to pace the feeding. 7. About halfway through the feeding, switch arms so you are holding your baby on the other side. This is similar to switching breasts when breastfeeding. 8. Watch for signs that your baby is full. When your baby has had enough to eat, he or she may: ? Eat more slowly or stop. ? Become distracted. ? Turn away and stop sucking. ? Become very relaxed or fall asleep. ? Have his or her hands open and relaxed. 9. When it is time to stop, gently remove the nipple from your baby's mouth. Offer the nipple again and let your baby feed for about three to five sucks. Remove the nipple again. Keep offering and removing until your baby refuses the nipple or no longer sucks. 10. Try to have a bottle feeding last about the same amount of time as a breastfeeding session. This is usually around 15-20 minutes. General tips  Watch for the following signs that your baby may be overfeeding or eating too quickly: ? Gulping. ? Drooling. ? Noisy feeding. ? Coughing or choking.  Start paced bottle feeding on demand. Over time, your baby will become hungry at predictable times.  Feed your baby in a quiet and comfortable place. Avoid distractions. Pay attention to pacing and signs of fullness.  Do not bottle feed your baby anything other than breast milk or formula until your baby's health care provider says that you can start other feedings.  Let your baby's health care provider know if your baby: ? Is fussy or  seems uncomfortable after feeding. ? Vomits after feedings. ? Refuses to take the bottle or your breast. Where to find more information [place] Department of Health and Human Services: [employer] Summary  Paced bottle feeding is a  way to bottle feed your baby that is more like breastfeeding.  Paced bottle feeding helps your baby learn to eat only when hungry and to avoid overeating.  Ask your baby's health care provider to recommend types of bottles and formula. If you plan to use pumped breast milk, learn how to pump and store your milk.  Follow the steps for performing paced feeding and stop when your baby shows signs of being full. This information is not intended to replace advice given to you by your health care provider. Make sure you discuss any questions you have with your health care provider. Document Revised: 03/20/2017 Document Reviewed: 03/20/2017 Elsevier Patient Education  [place]. Breastfeeding Tips for a Good Latch Latching is how your baby's mouth attaches to your nipple to breastfeed. It is an important part of breastfeeding. Your baby may have trouble latching for a number of reasons. A poor latch may cause you to have cracked or sore nipples or other problems. Follow these instructions at home: How to position your baby  Find a comfortable place to sit or lie down. Your neck and back should be well supported.  If you are seated, place a pillow or rolled-up blanket under your baby. This will bring him or her to the level of your breast.  Make sure that your baby's belly (abdomen) is facing your belly.  Try different positions to find one that works best for you and your baby. How to help your baby latch   To start, gently rub your breast. Move your fingertips in a circle as you massage from your chest wall toward your nipple. This helps milk flow. Keep doing this during feeding if needed.  Position your breast. Hold your breast  with four fingers underneath and your thumb above your nipple. Keep your fingers away from your nipple and your baby's mouth. Follow these steps to help your baby latch: 1. Rub your baby's lips gently with your finger or nipple. 2. When your baby's mouth is open wide enough, quickly bring your baby to your breast and place your whole nipple into your baby's mouth. Place as much of the colored area around your nipple (areola)as possible into your baby's mouth. 3. Your baby's tongue should be between his or her lower gum and your breast. 4. You should be able to see more areola above your baby's upper lip than below the lower lip. 5. When your baby starts sucking, you will feel a gentle pull on your nipple. You should not feel any pain. Be patient. It is common for a baby to suck for about 2-3 minutes to start the flow of breast milk. 6. Make sure that your baby's mouth is in the right position around your nipple. Your baby's lips should make a seal on your breast and be turned outward.  General instructions  Look for these signs that your baby has latched on to your nipple: ? The baby is quietly tugging or sucking without causing you pain. ? You hear the baby swallow after every 3 or 4 sucks. ? You see movement above and in front of the baby's ears while he or she is sucking.  Be aware of these signs that your baby has not latched on to your nipple: ? The baby makes sucking sounds or smacking sounds while feeding. ? You have nipple pain.  If your baby is not latched well, put your little finger between your baby's gums and your nipple. This will break the seal.  Then try to help your baby latch again.  If you keep having problems, get help from a breastfeeding specialist (Science writer). Contact a doctor if:  You have cracking or soreness in your nipples that lasts longer than 1 week.  You have nipple pain.  Your breasts are filled with too much milk (engorgement), and this does not  improve after 48-72 hours.  You have a plugged milk duct and a fever.  You follow the tips for a good latch but you keep having problems or concerns.  You have a pus-like fluid coming from your breast.  Your baby is not gaining weight.  Your baby loses weight. Summary  Latching is how your baby's mouth attaches to your nipple to breastfeed.  Try different positions for breastfeeding to find one that works best for you and your baby.  A poor latch may cause you to have cracked or sore nipples or other problems. This information is not intended to replace advice given to you by your health care provider. Make sure you discuss any questions you have with your health care provider. Document Revised: 09/23/2018 Document Reviewed: 01/08/2017 Elsevier Patient Education  [place]. Breast Pumping Tips Breast pumping is a way to get milk out of your breasts. You will then store the milk for your baby to use when you are away from home. There are three ways to pump. You can:  Use your hand to massage and squeeze your breast (hand expression).  Use a hand-held machine to manually pump your milk.  Use an electric machine to pump your milk. In the beginning you may not get much milk. After a few days your breasts should make more. Pumping can help you start making milk after your baby is born. Pumping helps you to keep making milk when you are away from your baby. When should I pump? You can start pumping soon after your baby is born. Follow these tips:  When you are with your baby: ? Pump after you breastfeed. ? Pump from the free breast while you breastfeed.  When you are away from your baby: ? Pump every 2-3 hours for 15 minutes. ? Pump both breasts at the same time if you can.  If your baby drinks formula, pump around the time your baby gets the formula.  If you drank alcohol, wait 2 hours before you pump.  If you are going to have surgery, ask your doctor when you should  pump again. How do I get ready to pump? Take steps to relax. Try these things to help your milk come in:  Smell your baby's blanket or clothes.  Look at a picture or video of your baby.  Sit in a quiet, private space.  Massage your breast and nipple.  Place a cloth on your breast. The cloth should be warm and a little wet.  Play relaxing music.  Picture your milk flowing. What are some tips? General tips for pumping breast milk   Always wash your hands before pumping.  If you do not get much milk or if pumping hurts, try different pump settings or a different kind of pump.  Drink enough fluid so your pee (urine) is clear or pale yellow.  Wear clothing that opens in the front or is easy to take off.  Pump milk into a clean bottle or container.  Do not use anything that has nicotine or tobacco. Examples are cigarettes and e-cigarettes. If you need help quitting, ask your doctor. Tips  for storing breast milk   Store breast milk in a clean, BPA-free container. These include: ? A glass or plastic bottle. ? A milk storage bag.  Store only 2-4 ounces of breast milk in each container.  Swirl the breast milk in the container. Do not shake it.  Write down the date you pumped the milk on the container.  This is how long you can store breast milk: ? Room temperature: 6-8 hours. It is best to use the milk within 4 hours. ? Cooler with ice packs: 24 hours. ? Refrigerator: 5-8 days, if the milk is clean. It is best to use the milk within 3 days. ? Freezer: 9-12 months, if the milk is clean and stored away from the freezer door. It is best to use the milk within 6 months.  Put milk in the back of the refrigerator or freezer.  Thaw frozen milk using warm water. Do not use the microwave. Tips for choosing a breast pump When choosing a pump, keep the following things in mind:  Manual breast pumps do not need electricity. They cost less. They can be hard to use.  Electric  breast pumps use electricity. They are more expensive. They are easier to use. They collect more milk.  The suction cup (flange) should be the right size.  Before you buy the pump, check if your insurance will pay for it. Tips for caring for a breast pump  Check the manual that came with your pump for cleaning tips.  Clean the pump after you use it. To do this: ? Wipe down the electrical part. Use a dry cloth or paper towel. Do not put this part in water or in cleaning products. ? Wash the plastic parts with soap and warm water. Or use the dishwasher if the manual says it is safe. You do not need to clean the tubing unless it touched breast milk. ? Let all the parts air dry. Avoid drying them with a cloth or towel. ? When the parts are clean and dry, put the pump back together. Then store the pump.  If there is water in the tubing when you want to pump: 1. Attach the tubing to the pump. 2. Turn on the pump. 3. Turn off the pump when the tube is dry.  Try not to touch the inside of pump parts. Summary  Pumping can help you start making milk after your baby is born. It lets you keep making milk when you are away from your baby.  When you are away from your baby, pump for about 15 minutes every 2-3 hours. Pump both breasts at the same time, if you can. This information is not intended to replace advice given to you by your health care provider. Make sure you discuss any questions you have with your health care provider. Document Revised: 09/23/2018 Document Reviewed: 07/08/2016 Elsevier Patient Education  2020 [place]. Postpartum Care After Vaginal Delivery This sheet gives you information about how to care for yourself from the time you deliver your baby to up to 6-12 weeks after delivery (postpartum period). Your health care provider may also give you more specific instructions. If you have problems or questions, contact your health care provider. Follow these instructions at  home: Vaginal bleeding  It is normal to have vaginal bleeding (lochia) after delivery. Wear a sanitary pad for vaginal bleeding and discharge. ? During the first week after delivery, the amount and appearance of lochia is often similar to a menstrual  period. ? Over the next few weeks, it will gradually decrease to a dry, yellow-brown discharge. ? For most women, lochia stops completely by 4-6 weeks after delivery. Vaginal bleeding can vary from woman to woman.  Change your sanitary pads frequently. Watch for any changes in your flow, such as: ? A sudden increase in volume. ? A change in color. ? Large blood clots.  If you pass a blood clot from your vagina, save it and call your health care provider to discuss. Do not flush blood clots down the toilet before talking with your health care provider.  Do not use tampons or douches until your health care provider says this is safe.  If you are not breastfeeding, your period should return 6-8 weeks after delivery. If you are feeding your child breast milk only (exclusive breastfeeding), your period may not return until you stop breastfeeding. Perineal care  Keep the area between the vagina and the anus (perineum) clean and dry as told by your health care provider. Use medicated pads and pain-relieving sprays and creams as directed.  If you had a cut in the perineum (episiotomy) or a tear in the vagina, check the area for signs of infection until you are healed. Check for: ? More redness, swelling, or pain. ? Fluid or blood coming from the cut or tear. ? Warmth. ? Pus or a bad smell.  You may be given a squirt bottle to use instead of wiping to clean the perineum area after you go to the bathroom. As you start healing, you may use the squirt bottle before wiping yourself. Make sure to wipe gently.  To relieve pain caused by an episiotomy, a tear in the vagina, or swollen veins in the anus (hemorrhoids), try taking a warm sitz bath 2-3 times a  day. A sitz bath is a warm water bath that is taken while you are sitting down. The water should only come up to your hips and should cover your buttocks. Breast care  Within the first few days after delivery, your breasts may feel heavy, full, and uncomfortable (breast engorgement). Milk may also leak from your breasts. Your health care provider can suggest ways to help relieve the discomfort. Breast engorgement should go away within a few days.  If you are breastfeeding: ? Wear a bra that supports your breasts and fits you well. ? Keep your nipples clean and dry. Apply creams and ointments as told by your health care provider. ? You may need to use breast pads to absorb milk that leaks from your breasts. ? You may have uterine contractions every time you breastfeed for up to several weeks after delivery. Uterine contractions help your uterus return to its normal size. ? If you have any problems with breastfeeding, work with your health care provider or Science writer.  If you are not breastfeeding: ? Avoid touching your breasts a lot. Doing this can make your breasts produce more milk. ? Wear a good-fitting bra and use cold packs to help with swelling. ? Do not squeeze out (express) milk. This causes you to make more milk. Intimacy and sexuality  Ask your health care provider when you can engage in sexual activity. This may depend on: ? Your risk of infection. ? How fast you are healing. ? Your comfort and desire to engage in sexual activity.  You are able to get pregnant after delivery, even if you have not had your period. If desired, talk with your health care provider about methods  of birth control (contraception). Medicines  Take over-the-counter and prescription medicines only as told by your health care provider.  If you were prescribed an antibiotic medicine, take it as told by your health care provider. Do not stop taking the antibiotic even if you start to feel  better. Activity  Gradually return to your normal activities as told by your health care provider. Ask your health care provider what activities are safe for you.  Rest as much as possible. Try to rest or take a nap while your baby is sleeping. Eating and drinking   Drink enough fluid to keep your urine pale yellow.  Eat high-fiber foods every day. These may help prevent or relieve constipation. High-fiber foods include: ? Whole grain cereals and breads. ? Brown rice. ? Beans. ? Fresh fruits and vegetables.  Do not try to lose weight quickly by cutting back on calories.  Take your prenatal vitamins until your postpartum checkup or until your health care provider tells you it is okay to stop. Lifestyle  Do not use any products that contain nicotine or tobacco, such as cigarettes and e-cigarettes. If you need help quitting, ask your health care provider.  Do not drink alcohol, especially if you are breastfeeding. General instructions  Keep all follow-up visits for you and your baby as told by your health care provider. Most women visit their health care provider for a postpartum checkup within the first 3-6 weeks after delivery. Contact a health care provider if:  You feel unable to cope with the changes that your child brings to your life, and these feelings do not go away.  You feel unusually sad or worried.  Your breasts become red, painful, or hard.  You have a fever.  You have trouble holding urine or keeping urine from leaking.  You have little or no interest in activities you used to enjoy.  You have not breastfed at all and you have not had a menstrual period for 12 weeks after delivery.  You have stopped breastfeeding and you have not had a menstrual period for 12 weeks after you stopped breastfeeding.  You have questions about caring for yourself or your baby.  You pass a blood clot from your vagina. Get help right away if:  You have chest pain.  You have  difficulty breathing.  You have sudden, severe leg pain.  You have severe pain or cramping in your lower abdomen.  You bleed from your vagina so much that you fill more than one sanitary pad in one hour. Bleeding should not be heavier than your heaviest period.  You develop a severe headache.  You faint.  You have blurred vision or spots in your vision.  You have bad-smelling vaginal discharge.  You have thoughts about hurting yourself or your baby. If you ever feel like you may hurt yourself or others, or have thoughts about taking your own life, get help right away. You can go to the nearest emergency department or call:  Your local emergency services (911 in the [place]  A suicide crisis helpline, such as the [facility] at 760-330-6455. This is open 24 hours a day. Summary  The period of time right after you deliver your newborn up to 6-12 weeks after delivery is called the postpartum period.  Gradually return to your normal activities as told by your health care provider.  Keep all follow-up visits for you and your baby as told by your health care provider. This information is not  intended to replace advice given to you by your health care provider. Make sure you discuss any questions you have with your health care provider. Document Revised: 06/06/2017 Document Reviewed: 03/17/2017 Elsevier Patient Education  2020 [employer].

## 2019-11-15 NOTE — Progress Notes (Signed)
Pt discharged with infant. Discharge instructions, prescriptions, and follow up appointments given to and reviewed with patient. Pt verbalized understanding. Escorted out by auxillary.  

## 2019-11-15 NOTE — Final Progress Note (Signed)
Subjective:  She is feeling well. Ambulating in room. Desires discharge home. No issues with bowel movement. Normal lochia. Breastfeeding without issue. Pain control is adequate. Voiding without difficulty. Tolerating a regular diet.  Objective:   Blood pressure 110/65, pulse 80, temperature 98.2 F (36.8 C), temperature source Oral, resp. rate 20, height 5\' 1"  (1.549 m), weight 93 kg, last menstrual period 02/26/2019, SpO2 99 %, unknown if currently breastfeeding.  General: NAD Pulmonary: no increased work of breathing Abdomen: non-distended, non-tender Uterus:  fundus firm at U; lochia normal Extremities: no edema, no erythema, no tenderness, no signs of DVT  Results for orders placed or performed during the hospital encounter of 11/13/19 (from the past 72 hour(s))  CBC     Status: Abnormal   Collection Time: 11/13/19 12:35 AM  Result Value Ref Range   WBC 8.3 4.0 - 10.5 K/uL   RBC 3.60 (L) 3.87 - 5.11 MIL/uL   Hemoglobin 11.0 (L) 12.0 - 15.0 g/dL   HCT 32.9 (L) 36.0 - 46.0 %   MCV 91.4 80.0 - 100.0 fL   MCH 30.6 26.0 - 34.0 pg   MCHC 33.4 30.0 - 36.0 g/dL   RDW 14.6 11.5 - 15.5 %   Platelets 166 150 - 400 K/uL   nRBC 0.0 0.0 - 0.2 %    Comment: Performed at [facility], [address] [place], [place] [address]  RPR     Status: None   Collection Time: 11/13/19 12:35 AM  Result Value Ref Range   RPR Ser Ql NON REACTIVE NON REACTIVE    Comment: Performed at [facility], [address], [place], [place] [address]  Type and screen     Status: None   Collection Time: 11/13/19 12:35 AM  Result Value Ref Range   ABO/RH(D) O NEG    Antibody Screen POS    Sample Expiration 11/16/2019,2359    Antibody Identification      PASSIVELY ACQUIRED ANTI-D Performed at [facility], [address] [place], [place] [address]   CBC     Status: Abnormal   Collection Time: 11/14/19  7:31 AM  Result Value Ref Range   WBC 12.3 (H) 4.0 - 10.5 K/uL   RBC 3.34  (L) 3.87 - 5.11 MIL/uL   Hemoglobin 10.4 (L) 12.0 - 15.0 g/dL   HCT 29.9 (L) 36.0 - 46.0 %   MCV 89.5 80.0 - 100.0 fL   MCH 31.1 26.0 - 34.0 pg   MCHC 34.8 30.0 - 36.0 g/dL   RDW 14.9 11.5 - 15.5 %   Platelets 148 (L) 150 - 400 K/uL   nRBC 0.0 0.0 - 0.2 %    Comment: Performed at [facility], [address] [place], [place] [address]  Rhogam injection     Status: None (Preliminary result)   Collection Time: 11/14/19  7:31 AM  Result Value Ref Range   Unit Number [id number]    Blood Component Type RHIG    Unit division 00    Status of Unit ISSUED    Transfusion Status      OK TO TRANSFUSE Performed at [facility], [address], [place], [place] [address]   Fetal screen     Status: None   Collection Time: 11/14/19  7:31 AM  Result Value Ref Range   Fetal Screen      NEG Performed at [facility], [address] [place], [place] [address]     Assessment:  29 y.o. G2P2003 postpartum day # 2  Plan:    1) Acute blood loss anemia - hemodynamically stable and asymptomatic - po ferrous sulfate  2) Blood Type --/--/O NEG (05/29 0035)   3) Rubella 3.42 (10/28 1022) / Varicella immune/ TDAP up to date   4) Breastfeeding without issue  5) Contraception- Vasectomy  6) Disposition- home today.

## 2019-11-16 LAB — TYPE AND SCREEN
ABO/RH(D): O NEG
Antibody Screen: POSITIVE

## 2019-11-17 ENCOUNTER — Encounter: Payer: Self-pay | Admitting: Obstetrics & Gynecology

## 2019-11-17 ENCOUNTER — Ambulatory Visit (INDEPENDENT_AMBULATORY_CARE_PROVIDER_SITE_OTHER): Payer: 59 | Admitting: Obstetrics & Gynecology

## 2019-11-17 ENCOUNTER — Other Ambulatory Visit: Payer: Self-pay

## 2019-11-17 VITALS — BP 130/80 | Ht 61.0 in | Wt 184.0 lb

## 2019-11-17 DIAGNOSIS — R609 Edema, unspecified: Secondary | ICD-10-CM

## 2019-11-17 DIAGNOSIS — R0602 Shortness of breath: Secondary | ICD-10-CM

## 2019-11-17 NOTE — Progress Notes (Signed)
Obstetrics & Gynecology Office Visit   Chief Complaint:  Chief Complaint  Patient presents with   Foot Swelling   Shortness of Breath    History of Present Illness: 29 y.o. G2P2003 being seen for follow up blood pressure check today.  The patient is postpartum from vaginal Twin deliveryThe established diagnosis for the patient is no HTN disorder at time of discharge.  She is currently on no antihypertensives.  She reports having episode of worsening LE edema and then SOB with coughing up of phlegm and fluid this early am; no headache or visual changes.  This is new for her.  Medication list reviewed medications which may contribute to BP elevation were not noted.  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  Tobacco Use   Smoking status: Never Smoker   Smokeless tobacco: Never Used  Substance and Sexual Activity   Alcohol use: Not on file   Drug use: No   Sexual activity: Not Currently    Partners: Male    Birth  control/protection: None    Comment: Husband scheduled for vasectomy  Other Topics Concern   Not on file  Social History Narrative   Not on file   Social Determinants of Health   Financial Resource Strain:    Difficulty of Paying Living Expenses:   Food Insecurity:    Worried About Charity fundraiser in the Last Year:    Arboriculturist in the Last Year:   Transportation Needs:    Film/video editor (Medical):    Lack of Transportation (Non-Medical):   Physical Activity:    Days of Exercise per Week:    Minutes of Exercise per Session:   Stress:    Feeling of Stress :   Social Connections:    Frequency of Communication with Friends and Family:    Frequency of Social Gatherings with Friends and Family:    Attends Religious Services:    Active Member of Clubs or Organizations:    Attends Archivist Meetings:    Marital Status:   Intimate Partner Violence:    Fear of Current or Ex-Partner:    Emotionally Abused:    Physically Abused:    Sexually Abused:     Allergies:  No Known Allergies  Medications: Prior to Admission medications   Medication Sig Start Date End Date Taking? Authorizing Provider  folic acid (  FOLVITE) 1 MG tablet Take 1 tablet (1 mg total) by mouth daily. 04/21/19  Yes Dalia Heading, CNM  Prenatal Vit-Fe Fumarate-FA (MULTIVITAMIN-PRENATAL) 27-0.8 MG TABS tablet Take 1 tablet by mouth daily at 12 noon.   Yes [provider]  UNABLE TO FIND Take 1 capsule by mouth daily. PRENATAL DHA WITH OMEGA 3 400MG    [provider]  UNABLE TO FIND 2 tablets. Blood Builder    [provider]    Review of Systems  Constitutional: Negative for chills, fever and malaise/fatigue.  HENT: Negative for congestion, sinus pain and sore throat.   Eyes: Negative for blurred vision and pain.  Respiratory: Positive for shortness of breath. Negative for cough and wheezing.   Cardiovascular: Positive for leg  swelling. Negative for chest pain.  Gastrointestinal: Negative for abdominal pain, constipation, diarrhea, heartburn, nausea and vomiting.  Genitourinary: Negative for dysuria, frequency, hematuria and urgency.  Musculoskeletal: Negative for back pain, joint pain, myalgias and neck pain.  Skin: Negative for itching and rash.  Neurological: Negative for dizziness, tremors and weakness.  Endo/Heme/Allergies: Does not bruise/bleed easily.  Psychiatric/Behavioral: Negative for depression. The patient is not nervous/anxious and does not have insomnia.     Physical Exam Blood pressure 130/80, height 5' 1" (1.549 m), weight 184 lb (83.5 kg), unknown if currently breastfeeding. General: NAD HEENT: normocephalic, anicteric Pulmonary: No increased work of breathing   CTS B, no wheeze or rhonchi Cardiovascular: RRR, distal pulses 2+ Extremities: 1+edema, no erythema, no tenderness Neurologic: Grossly intact Psychiatric: mood appropriate, affect full  Assessment: 29 y.o. [id number] presenting for blood pressure evaluation today due to concerning symptoms of fluid retention in the early postpartum period  Plan: Problem List Items Addressed This Visit    Fluid retention    -  Primary    No s/sx preeclampsia or hypertensive disorder Counseled as to etiology and options to treat fluid retention post partum Monitor breast feeding and for s/sx PPD Plans vasec fpr PP contraception later  Barnett Applebaum, MD, [place] 11/17/2019  3:55 PM

## 2019-11-17 NOTE — Telephone Encounter (Signed)
Work-in 11:30

## 2019-11-17 NOTE — Telephone Encounter (Signed)
Patient states she needed an afternoon appointment. There was an cancellation at 3:50 for today

## 2019-11-18 LAB — SURGICAL PATHOLOGY

## 2019-12-22 ENCOUNTER — Other Ambulatory Visit: Payer: Self-pay

## 2019-12-22 ENCOUNTER — Ambulatory Visit (INDEPENDENT_AMBULATORY_CARE_PROVIDER_SITE_OTHER): Payer: 59 | Admitting: Obstetrics & Gynecology

## 2019-12-22 ENCOUNTER — Other Ambulatory Visit (HOSPITAL_COMMUNITY)
Admission: RE | Admit: 2019-12-22 | Discharge: 2019-12-22 | Disposition: A | Discharge status: Home / Self Care | Payer: 59 | Source: Ambulatory Visit | Attending: Obstetrics & Gynecology | Admitting: Obstetrics & Gynecology

## 2019-12-22 ENCOUNTER — Encounter: Payer: Self-pay | Admitting: Obstetrics & Gynecology

## 2019-12-22 DIAGNOSIS — Z124 Encounter for screening for malignant neoplasm of cervix: Secondary | ICD-10-CM | POA: Diagnosis not present

## 2019-12-22 NOTE — Progress Notes (Signed)
  OBSTETRICS POSTPARTUM CLINIC PROGRESS NOTE  Subjective:     Marie Stephenson is a 29 y.o. G74P2003 female who presents for a postpartum visit. She is 6 weeks postpartum following a Term pregnancy or Multifetal pregnancy and delivery by Vaginal, no problems at delivery.  I have fully reviewed the prenatal and intrapartum course. Anesthesia: epidural.  Postpartum course has been complicated by uncomplicated.  Baby is feeding by Breast.  Bleeding: patient has not  resumed menses.  Bowel function is normal. Bladder function is normal.  Patient is not sexually active. Contraception method desired is vasectomy.  Postpartum depression screening: negative. Edinburgh 1.  The following portions of the patient's history were reviewed and updated as appropriate: allergies, current medications, past family history, past medical history, past social history, past surgical history and problem list.  Review of Systems Pertinent items are noted in HPI.  Objective:    BP 120/80   Ht 5\' 1"  (1.549 m)   Wt 164 lb (74.4 kg)   BMI 30.99 kg/m   General:  alert and no distress   Breasts:  inspection negative, no nipple discharge or bleeding, no masses or nodularity palpable  Lungs: clear to auscultation bilaterally  Heart:  regular rate and rhythm, S1, S2 normal, no murmur, click, rub or gallop  Abdomen: soft, non-tender; bowel sounds normal; no masses,  no organomegaly.     Vulva:  normal  Vagina: normal vagina, no discharge, exudate, lesion, or erythema  Cervix:  no cervical motion tenderness and no lesions  Corpus: normal size, contour, position, consistency, mobility, non-tender  Adnexa:  normal adnexa and no mass, fullness, tenderness  Rectal Exam: Not performed.          Assessment:  Post Partum Care visit 1. Postpartum care and examination  2. Screening for cervical cancer - Cytology - PAP   Plan:  See orders and Patient Instructions Contraceptive counseling for vasectomy  planned for July 29; need for abstinance and protection until follow up testing advised Resume all normal activities Follow up in: 12 months or as needed.   Barnett Applebaum, MD, [facility], [facility] 12/22/2019  10:36 AM

## 2019-12-22 NOTE — Patient Instructions (Signed)
Thank you for choosing [facility]. As part of our ongoing efforts to improve patient experience, we would appreciate your feedback. Please fill out the short survey that you will receive by mail or MyChart. Your opinion is important to us! -Dr Bunnie Rehberg  

## 2019-12-23 LAB — CYTOLOGY - PAP: Diagnosis: NEGATIVE

## 2020-02-28 IMAGING — CT CT ABD-PELV W/ CM
2 of 4 series · 16 of 46 positions shown, 18 images · IV contrast (APPLIED)
Comparison: None

CLINICAL DATA: RIGHT abdominal pain since [REDACTED] night, diarrhea
for 4 days, cramping

EXAM:
CT ABDOMEN AND PELVIS WITH CONTRAST
TECHNIQUE: Multidetector CT imaging of the abdomen and pelvis was performed
using the standard protocol following bolus administration of
intravenous contrast.
CONTRAST:  75mL OMNIPAQUE IOHEXOL 300 MG/ML  SOLN

[Series 2: routine abd/pel with · axial · 0.73mm/px · z∈[-914,-534]mm · 13 of 84 slices shown, 15 images]
[im 4/84  soft-tissue]
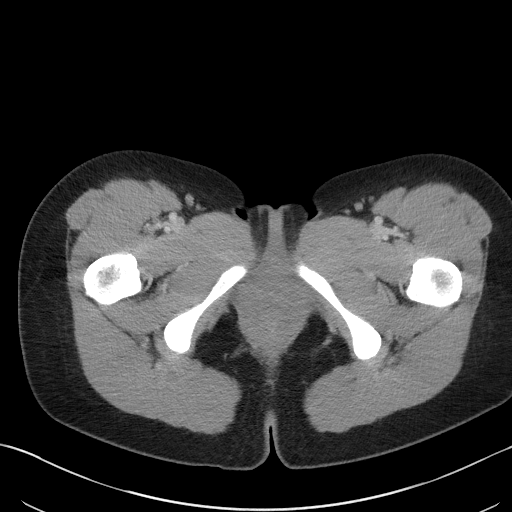
[im 4/84  bone]
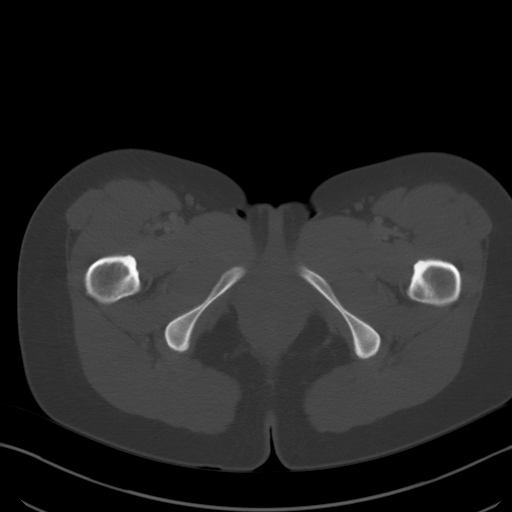
[im 10/84  soft-tissue]
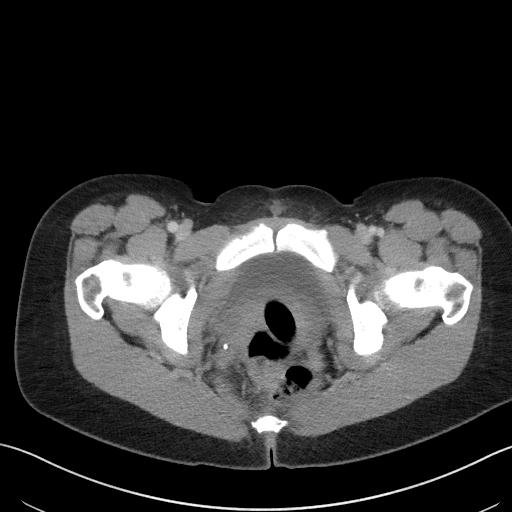
[im 17/84  soft-tissue]
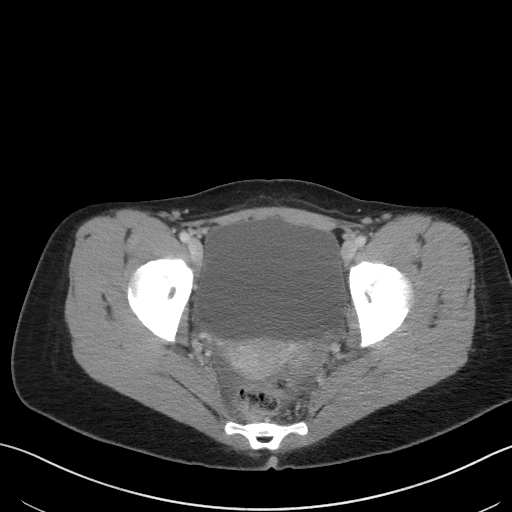
[im 24/84  soft-tissue]
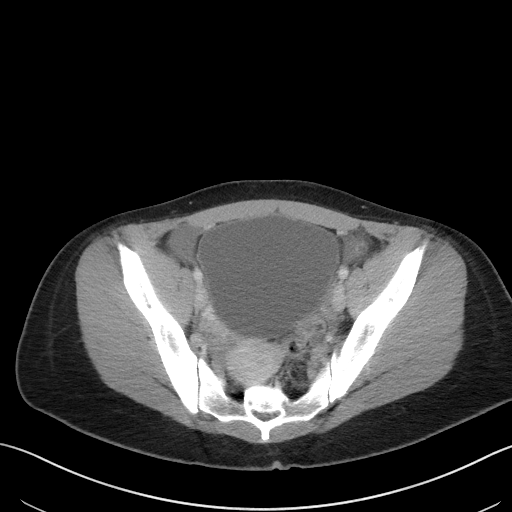
[im 30/84  soft-tissue]
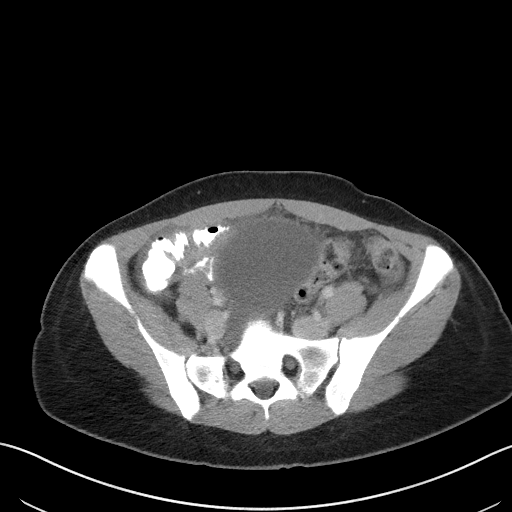
[im 37/84  soft-tissue]
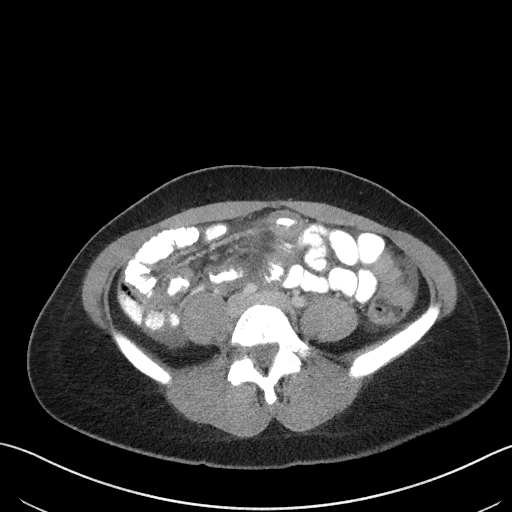
[im 44/84  soft-tissue]
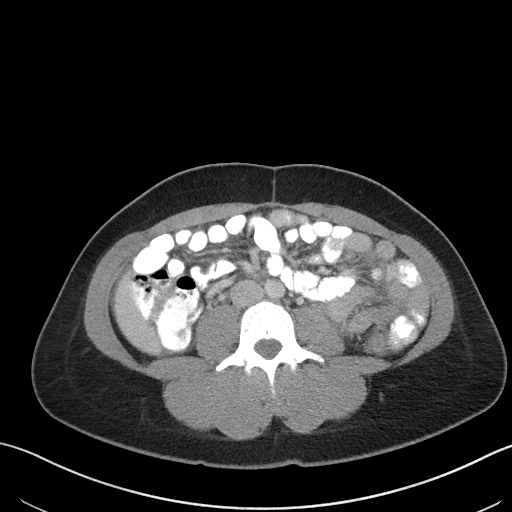
[im 47/84  soft-tissue]
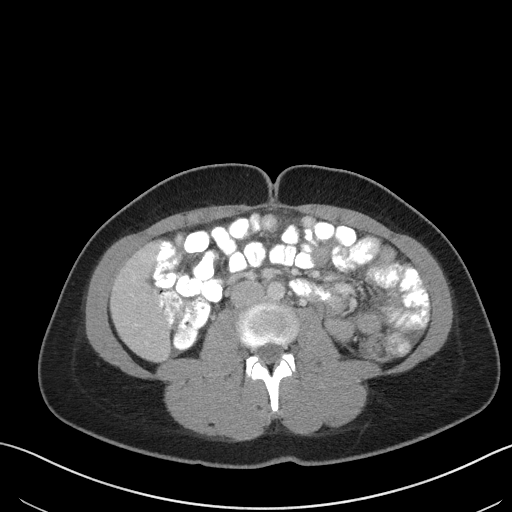
[im 54/84  soft-tissue]
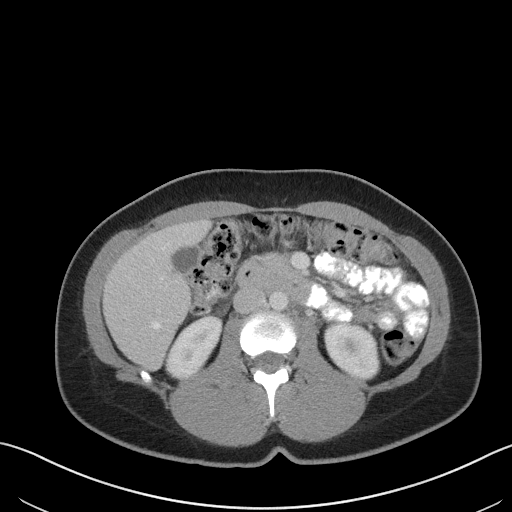
[im 54/84  bone]
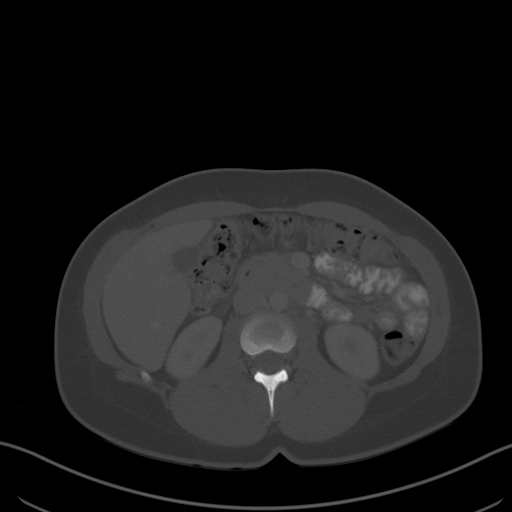
[im 60/84  soft-tissue]
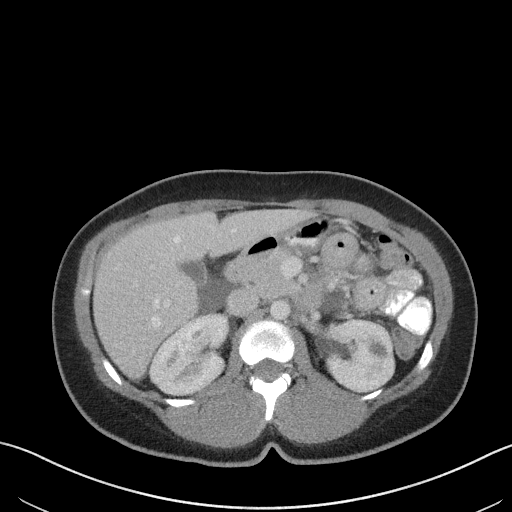
[im 67/84  soft-tissue]
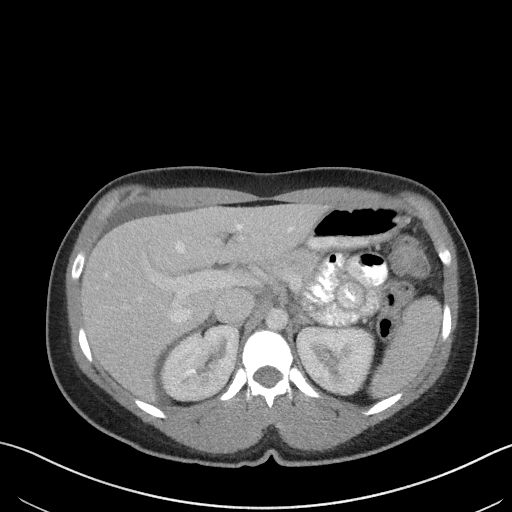
[im 74/84  soft-tissue]
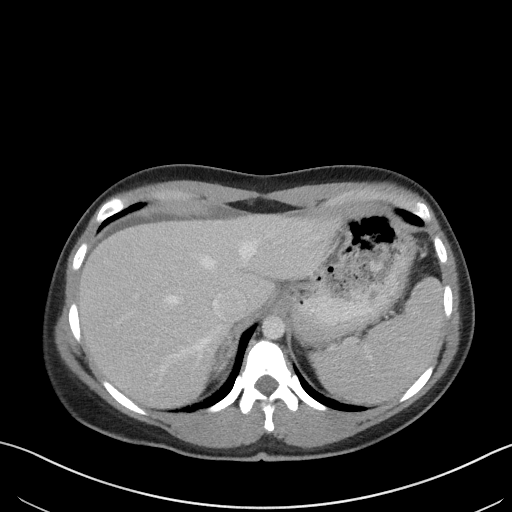
[im 80/84  soft-tissue]
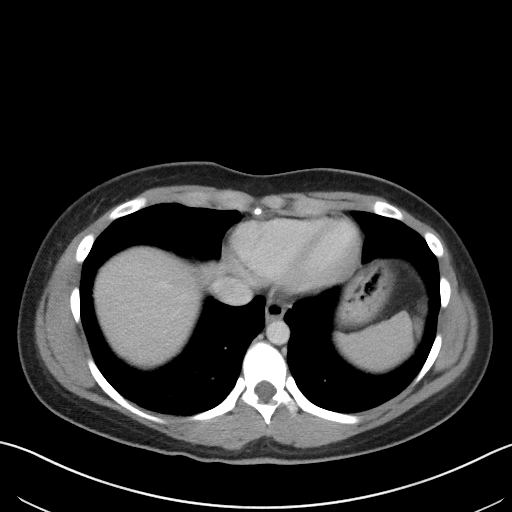

[Series 5: coronal st · coronal · 0.69mm/px · 3 of 74 slices shown]
[im 25/74  soft-tissue]
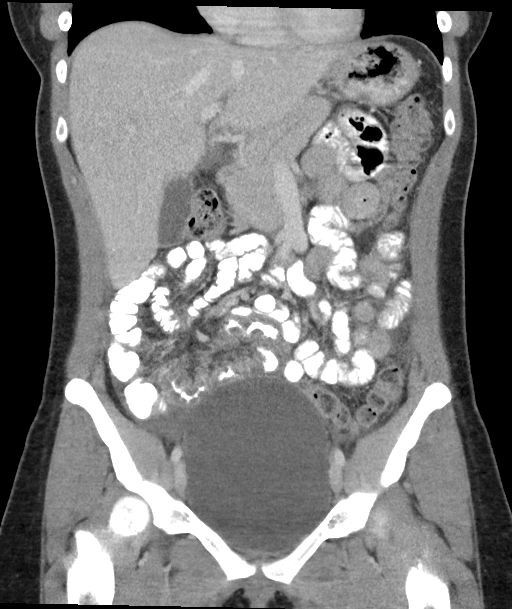
[im 33/74  soft-tissue]
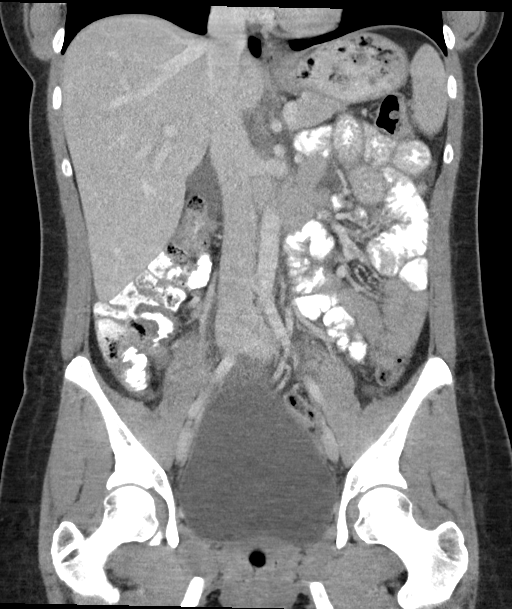
[im 41/74  soft-tissue]
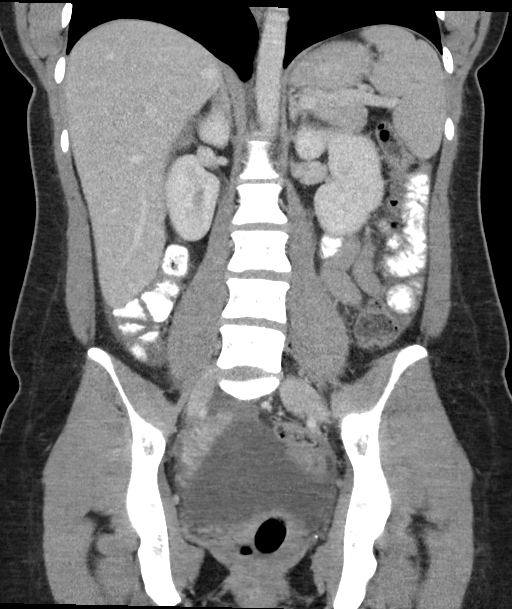

[16 of 46 positions shown; findings below may reference images not displayed]

FINDINGS: Lower chest: Lung bases clear

Hepatobiliary: Gallbladder and liver normal appearance

Pancreas: Normal appearance

Spleen: Normal appearance

Adrenals/Urinary Tract: Adrenal glands normal appearance. Small LEFT
renal cyst and BILATERAL nonobstructing renal calculi. No
hydronephrosis or ureteral abnormalities. Well distended otherwise
normal appearing urinary bladder.

Stomach/Bowel: Bowel wall thickening of multiple small bowel loops
in the RIGHT mid abdomen and upper pelvis associated with mesenteric
edema. Findings are consistent with enteritis. Stomach decompressed
and unremarkable. Colon and remaining small bowel loops normal
appearance. Normal appendix opacified by contrast though mild peri
appendiceal infiltration is noted, likely secondary.

Vascular/Lymphatic: Vascular structures patent on non targeted exam.
Aorta normal caliber. Few normal sized mesenteric lymph nodes
without abdominal or pelvic adenopathy.

Reproductive: Unremarkable uterus and ovaries

Other: Free fluid in pelvis, RIGHT pericolic gutter, interloop, and
perihepatic. No free air or hernia.

Musculoskeletal: Unremarkable
IMPRESSION: Bowel wall thickening of multiple small bowel loops in the mid
abdomen and upper pelvis compatible with enteritis; differential
diagnosis would include infection and inflammatory bowel disease.

Associated scattered free intraperitoneal fluid without free air to
suggest perforation.

## 2020-02-28 IMAGING — US US ABDOMEN LIMITED
1 series · 14 of 25 positions shown · non-contrast
Comparison: None.

CLINICAL DATA: Right upper quadrant pain for 4 days.

EXAM:
ULTRASOUND ABDOMEN LIMITED RIGHT UPPER QUADRANT

[Series 1: us abdomen limited · 14 of 56 slices shown]
[im 1/56]
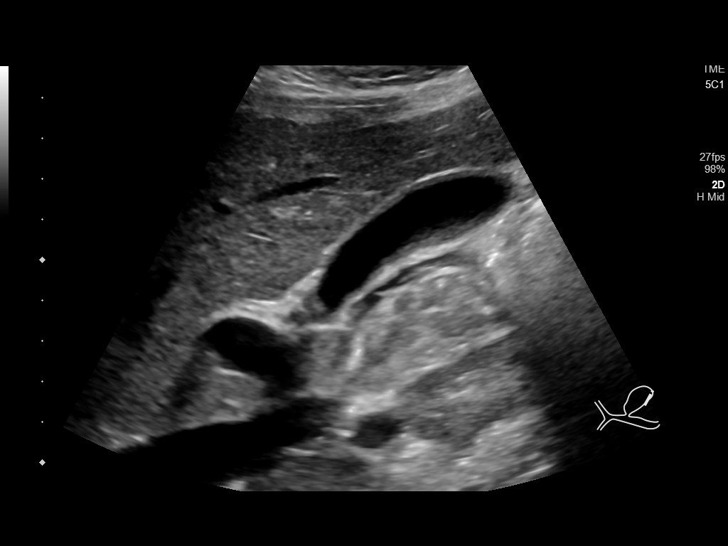
[im 5/56]
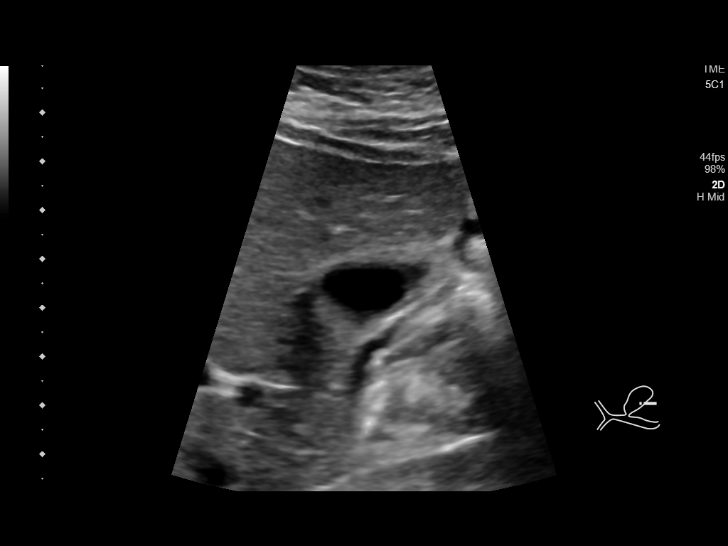
[im 10/56]
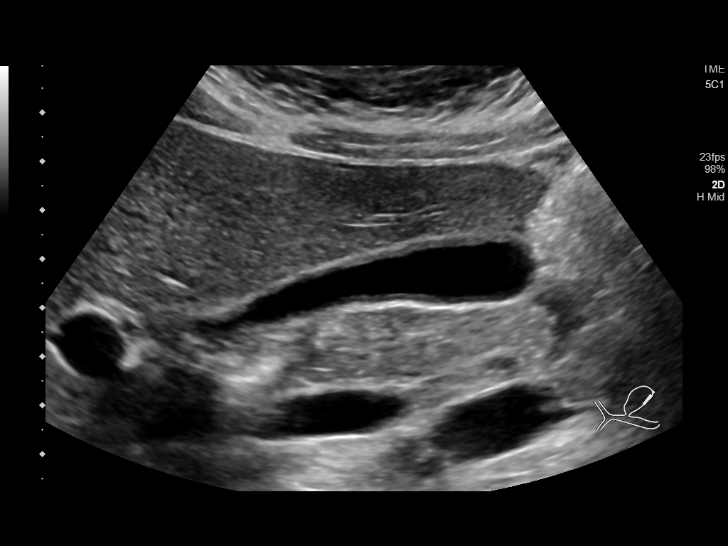
[im 14/56]
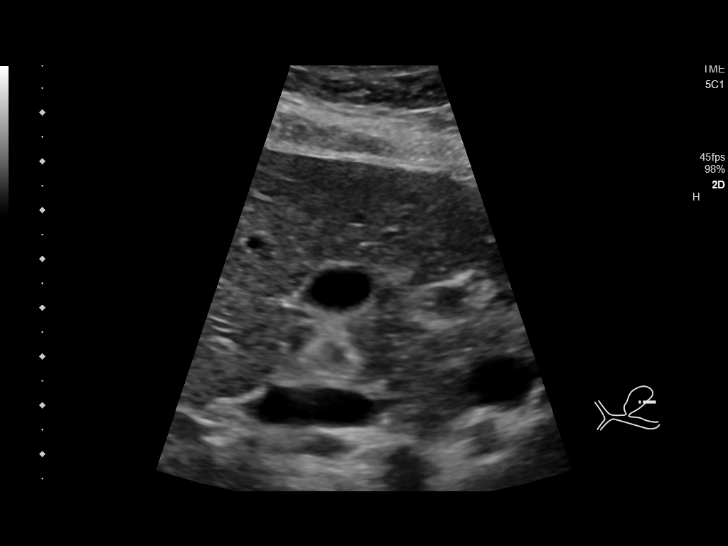
[im 19/56]
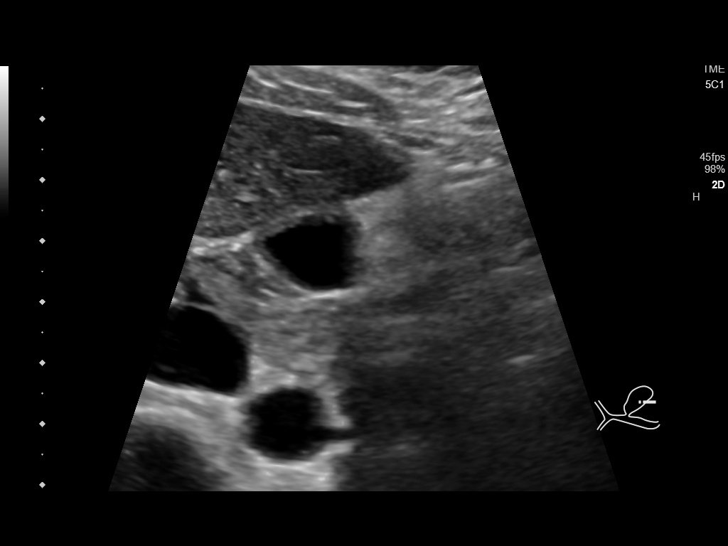
[im 21/56]
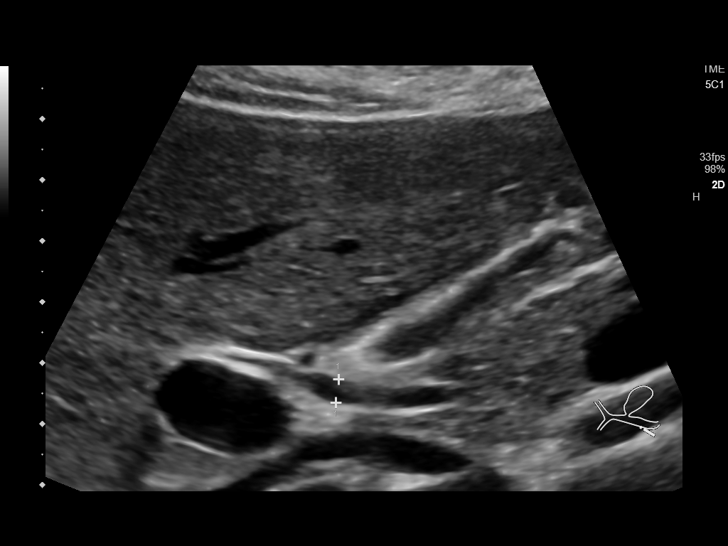
[im 26/56]
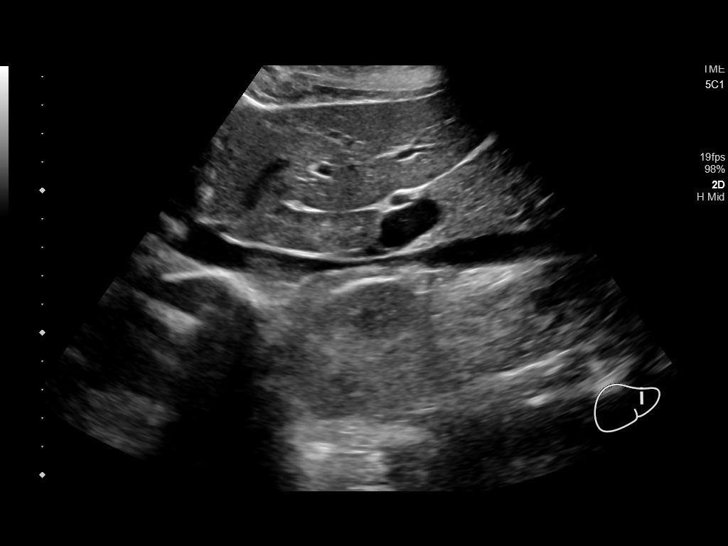
[im 30/56]
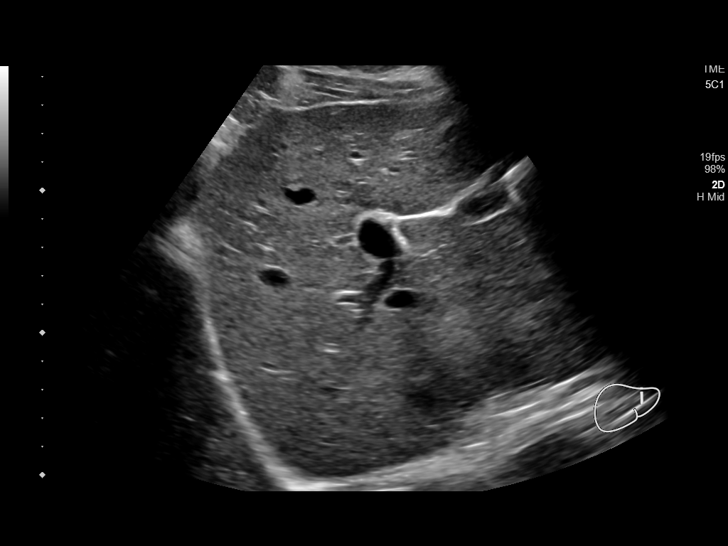
[im 35/56]
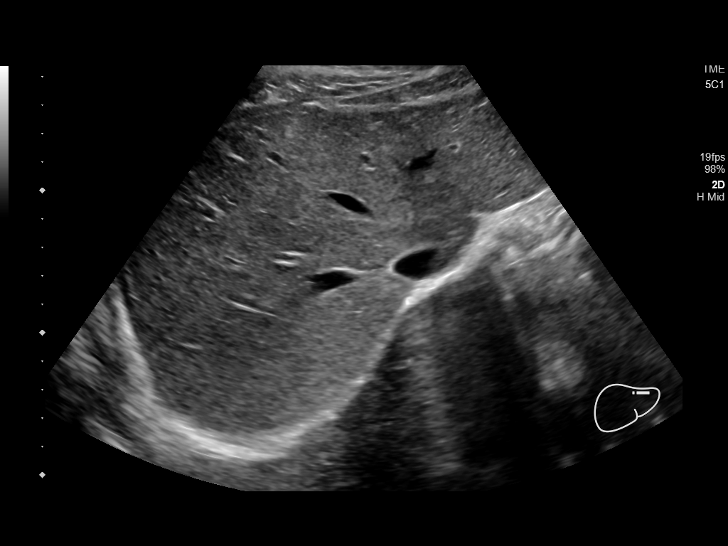
[im 37/56]
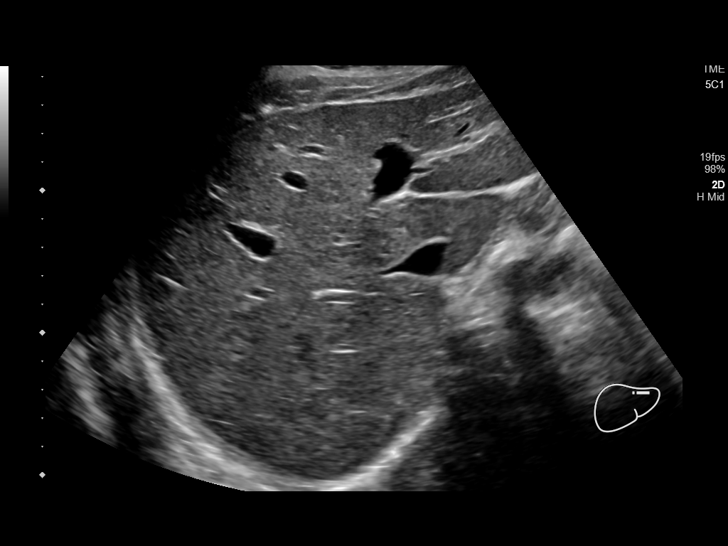
[im 42/56]
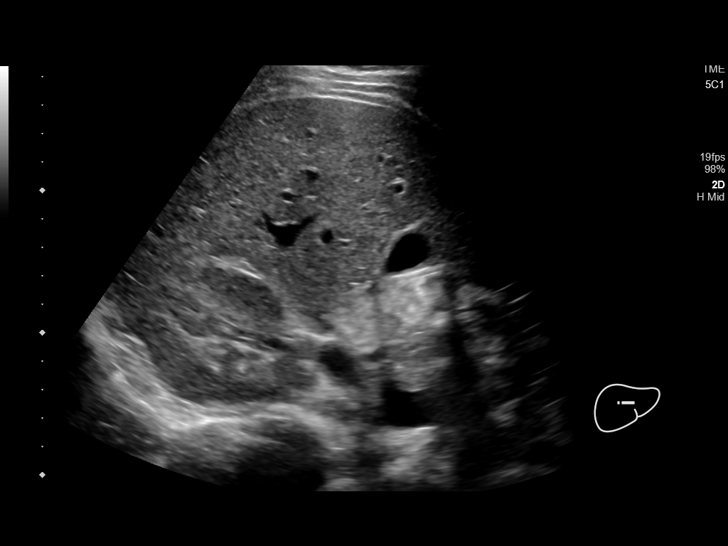
[im 46/56]
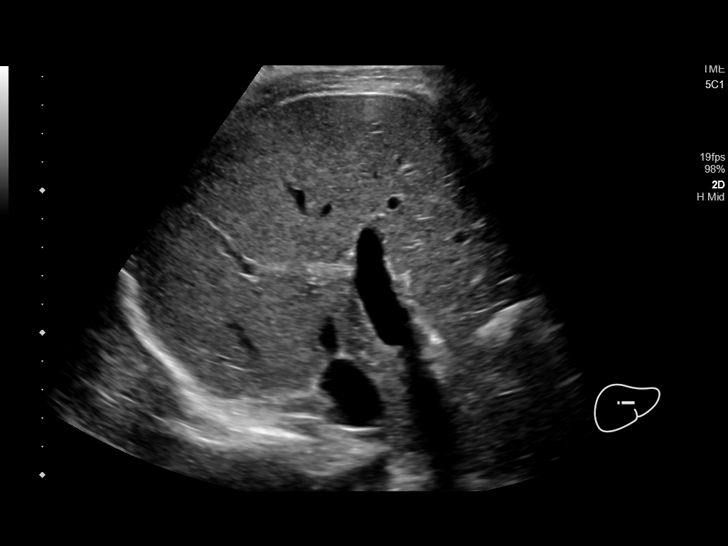
[im 51/56]
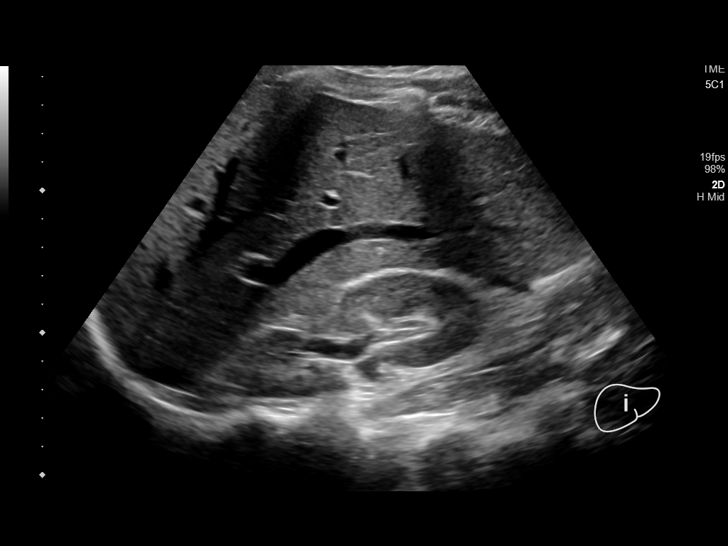
[im 56/56]
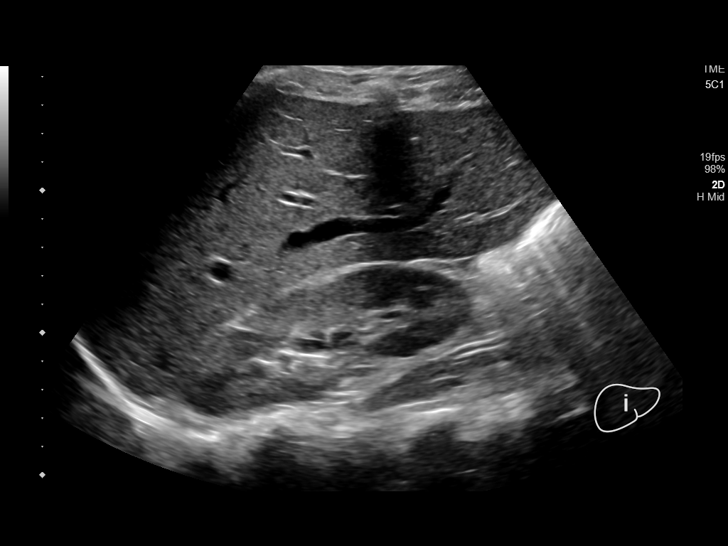

[14 of 25 positions shown; findings below may reference images not displayed]

FINDINGS: Gallbladder:

No gallstones or wall thickening visualized. No sonographic Murphy
sign noted by sonographer.

Common bile duct:

Diameter: 3.9 mm, normal

Liver:

No focal lesion identified. Within normal limits in parenchymal
echogenicity. Portal vein is patent on color Doppler imaging with
normal direction of blood flow towards the liver.
IMPRESSION: Normal examination.

## 2020-08-01 ENCOUNTER — Other Ambulatory Visit: Payer: Self-pay

## 2020-08-01 ENCOUNTER — Ambulatory Visit (INDEPENDENT_AMBULATORY_CARE_PROVIDER_SITE_OTHER): Payer: 59 | Admitting: Adult Health

## 2020-08-01 ENCOUNTER — Encounter: Payer: Self-pay | Admitting: Adult Health

## 2020-08-01 VITALS — BP 119/74 | HR 70 | Temp 97.9°F | Resp 16 | Ht 61.0 in | Wt 132.0 lb

## 2020-08-01 DIAGNOSIS — Z1389 Encounter for screening for other disorder: Secondary | ICD-10-CM

## 2020-08-01 DIAGNOSIS — Z872 Personal history of diseases of the skin and subcutaneous tissue: Secondary | ICD-10-CM | POA: Diagnosis not present

## 2020-08-01 DIAGNOSIS — Z862 Personal history of diseases of the blood and blood-forming organs and certain disorders involving the immune mechanism: Secondary | ICD-10-CM

## 2020-08-01 DIAGNOSIS — E559 Vitamin D deficiency, unspecified: Secondary | ICD-10-CM | POA: Insufficient documentation

## 2020-08-01 DIAGNOSIS — Z0000 Encounter for general adult medical examination without abnormal findings: Secondary | ICD-10-CM | POA: Diagnosis not present

## 2020-08-01 DIAGNOSIS — W891XXA Exposure to tanning bed, initial encounter: Secondary | ICD-10-CM | POA: Insufficient documentation

## 2020-08-01 LAB — POCT URINALYSIS DIPSTICK
Bilirubin, UA: NEGATIVE
Blood, UA: NEGATIVE
Glucose, UA: NEGATIVE
Ketones, UA: NEGATIVE
Leukocytes, UA: NEGATIVE
Nitrite, UA: NEGATIVE
Protein, UA: POSITIVE — AB
Spec Grav, UA: <=1.005 — AB (ref 1.010–1.025)
Urobilinogen, UA: 0.2 E.U./dL
pH, UA: 7.5 (ref 5.0–8.0)

## 2020-08-01 NOTE — Progress Notes (Addendum)
New patient visit   Patient: Marie Stephenson   DOB: [birth year]   30 y.o. Female  MRN: [id number] Visit Date: 08/01/2020  Today's healthcare provider: Marcille Buffy, FNP   Chief Complaint  Patient presents with  . New Patient (Initial Visit)   Subjective    Marie Stephenson is a 30 y.o. female who presents today as a new patient to establish care.  HPI  Patient presents in office today to establish care, she stats that she feels well and does have concerns to address. Patient reports that she follows a well balanced diet, she is exercising 3-5x a week by lifting weights, patient also reports that sleep patters are well.   Patient would like to address concern of a possible knot/lump on the left side of her neck that has been present for one year. Patient would like to discuss referral for dermatology due to skin exposure  To sun and past tanning bed use. She does use sunscreen now.   Denies any abdominal pain.   Has a 30 year old. Had twins vaginal birth 8 months ago, was on bed rest at 20 some weeks until delivery for preeclampsia.  G2P3.  Menstrual has been regular since 2 months after last pregnancy. Sees gynecology. Not lactating.   No birth control patient has had vasectomy.   She requests her hormone levels to be tested, did review with her that insurance may not cover these. She verbalizes understanding of this but would still prefer testing.   Patient  denies any fever, body aches,chills, rash, chest pain, shortness of breath, nausea, vomiting, or diarrhea.   Denies dizziness, lightheadedness, pre syncopal or syncopal episodes.     Past Medical History:  Diagnosis Date  . Anemia   . BRCA negative 08/2018   MyRisk neg except CDK4 VUS  . Family history of breast cancer 08/2018   IBIS=11%/riskscore=8.4%  . Heart murmur    since birth  . Human papilloma virus    Past Surgical History:  Procedure Laterality Date  . WISDOM TOOTH  EXTRACTION     Family Status  Relation Name Status  . Mother  Alive  . Father  Alive  . Brother  Alive  . MGM  Alive  . MGF  Alive  . PGM  Deceased  . PGF  Deceased   Family History  Problem Relation Age of Onset  . Endometriosis Mother   . Hypertension Mother   . Breast cancer Maternal Grandmother 39       great grandmother  . Dementia Maternal Grandmother   . Colon cancer Maternal Grandmother   . Liver cancer Maternal Grandmother   . Leukemia Maternal Grandfather   . Hypertension Maternal Grandfather   . Liver cancer Paternal Grandfather    Social History   Socioeconomic History  . Marital status: Married    Spouse name: Dorothea Ogle  . Number of children: 1  . Years of education: 80  . Highest education level: Not on file  Occupational History  . Occupation: SELF EMPLOYED     Comment: [employer]  Tobacco Use  . Smoking status: Never Smoker  . Smokeless tobacco: Never Used  Vaping Use  . Vaping Use: Never used  Substance and Sexual Activity  . Alcohol use: Yes    Alcohol/week: 1.0 standard drink    Types: 1 Glasses of wine per week  . Drug use: No  . Sexual activity: Yes    Partners: Male    Birth control/protection:  None    Comment: Husband scheduled for vasectomy  Other Topics Concern  . Not on file  Social History Narrative  . Not on file   Social Determinants of Health   Financial Resource Strain: Not on file  Food Insecurity: Not on file  Transportation Needs: Not on file  Physical Activity: Not on file  Stress: Not on file  Social Connections: Not on file   Outpatient Medications Prior to Visit  Medication Sig  . [DISCONTINUED] UNABLE TO FIND Take 1 capsule by mouth daily. PRENATAL DHA WITH OMEGA 3 400MG (Patient not taking: Reported on 12/22/2019)   No facility-administered medications prior to visit.   No Known Allergies  Immunization History  Administered Date(s) Administered  . Influenza-Unspecified 03/19/2011, 04/06/2012,  04/08/2013  . Tdap 09/27/2019    Health Maintenance  Topic Date Due  . Hepatitis C Screening  Never done  . INFLUENZA VACCINE  01/16/2020  . PAP-Cervical Cytology Screening  12/22/2022  . PAP SMEAR-Modifier  12/22/2022  . TETANUS/TDAP  09/26/2029  . HIV Screening  Completed    Patient Care Team: Flinchum, Kelby Aline, FNP as PCP - General (Family Medicine) Rico Junker, RN as Registered Nurse Theodore Demark, RN as Registered Nurse Bary Castilla Forest Gleason, MD (General Surgery)  Review of Systems  Constitutional: Negative.   HENT: Negative.   Respiratory: Negative.   Cardiovascular: Negative.   Gastrointestinal: Negative.   Genitourinary: Negative.   Musculoskeletal: Negative.   Hematological: Bruises/bleeds easily (bruises no more than normal ).  Psychiatric/Behavioral: Negative.   All other systems reviewed and are negative.     Objective    BP 119/74   Pulse 70   Temp 97.9 F (36.6 C) (Oral)   Resp 16   Ht 5' 1" (1.549 m)   Wt 132 lb (59.9 kg)   LMP 07/28/2020 (Exact Date)   SpO2 100%   Breastfeeding No   BMI 24.94 kg/m  Physical Exam Vitals and nursing note reviewed.  Constitutional:      Appearance: Normal appearance. She is not ill-appearing.  HENT:     Head: Normocephalic and atraumatic.     Right Ear: Hearing, tympanic membrane, ear canal and external ear normal.     Left Ear: Hearing, tympanic membrane, ear canal and external ear normal.     Nose: Nose normal. No congestion or rhinorrhea.     Right Turbinates: Not enlarged.     Left Turbinates: Not enlarged.     Right Sinus: No maxillary sinus tenderness or frontal sinus tenderness.     Left Sinus: No maxillary sinus tenderness or frontal sinus tenderness.     Mouth/Throat:     Mouth: Mucous membranes are moist.     Pharynx: Oropharynx is clear. No oropharyngeal exudate or posterior oropharyngeal erythema.     Tonsils: No tonsillar exudate or tonsillar abscesses. 1+ on the right. 2+ on the left.   Eyes:     General: No scleral icterus.       Right eye: No discharge.        Left eye: No discharge.     Conjunctiva/sclera: Conjunctivae normal.     Pupils: Pupils are equal, round, and reactive to light.  Cardiovascular:     Rate and Rhythm: Normal rate and regular rhythm.     Pulses: Normal pulses.     Heart sounds: Normal heart sounds. No murmur heard. No friction rub. No gallop.   Pulmonary:     Effort: Pulmonary effort is normal. No respiratory  distress.     Breath sounds: Normal breath sounds. No stridor. No wheezing, rhonchi or rales.  Chest:     Chest wall: No tenderness.  Abdominal:     General: Bowel sounds are normal. There is no distension.     Palpations: Abdomen is soft.     Tenderness: There is no abdominal tenderness. There is no guarding.  Genitourinary:    Comments: Deferred.  Musculoskeletal:        General: No tenderness. Normal range of motion.     Cervical back: Normal range of motion and neck supple.     Right lower leg: No edema.     Left lower leg: No edema.  Lymphadenopathy:     Cervical: No cervical adenopathy.  Skin:    General: Skin is warm.     Findings: No erythema, lesion or rash.  Neurological:     General: No focal deficit present.     Mental Status: She is alert and oriented to person, place, and time.     Motor: No weakness.     Gait: Gait normal.  Psychiatric:        Mood and Affect: Mood normal.        Behavior: Behavior normal.        Thought Content: Thought content normal.        Judgment: Judgment normal.     Depression Screen PHQ 2/9 Scores 08/01/2020 08/01/2020 11/01/2019 10/11/2019  PHQ - 2 Score 0 0 0 0  PHQ- 9 Score 0 - - -   Results for orders placed or performed in visit on 08/01/20  CBC with Differential/Platelet  Result Value Ref Range   WBC 5.2 3.4 - 10.8 x10E3/uL   RBC 4.23 3.77 - 5.28 x10E6/uL   Hemoglobin 13.3 11.1 - 15.9 g/dL   Hematocrit 37.8 34.0 - 46.6 %   MCV 89 79 - 97 fL   MCH 31.4 26.6 - 33.0 pg    MCHC 35.2 31.5 - 35.7 g/dL   RDW 11.9 11.7 - 15.4 %   Platelets 270 150 - 450 x10E3/uL   Neutrophils 72 Not Estab. %   Lymphs 23 Not Estab. %   Monocytes 5 Not Estab. %   Eos 0 Not Estab. %   Basos 0 Not Estab. %   Neutrophils Absolute 3.6 1.4 - 7.0 x10E3/uL   Lymphocytes Absolute 1.2 0.7 - 3.1 x10E3/uL   Monocytes Absolute 0.3 0.1 - 0.9 x10E3/uL   EOS (ABSOLUTE) 0.0 0.0 - 0.4 x10E3/uL   Basophils Absolute 0.0 0.0 - 0.2 x10E3/uL   Immature Granulocytes 0 Not Estab. %   Immature Grans (Abs) 0.0 0.0 - 0.1 x10E3/uL  Comprehensive metabolic panel  Result Value Ref Range   Glucose 75 65 - 99 mg/dL   BUN 9 6 - 20 mg/dL   Creatinine, Ser 0.73 0.57 - 1.00 mg/dL   GFR calc non Af Amer 112 >59 mL/min/1.73   GFR calc Af Amer 129 >59 mL/min/1.73   BUN/Creatinine Ratio 12 9 - 23   Sodium 141 134 - 144 mmol/L   Potassium 4.6 3.5 - 5.2 mmol/L   Chloride 100 96 - 106 mmol/L   CO2 23 20 - 29 mmol/L   Calcium 9.0 8.7 - 10.2 mg/dL   Total Protein 7.0 6.0 - 8.5 g/dL   Albumin 4.6 3.9 - 5.0 g/dL   Globulin, Total 2.4 1.5 - 4.5 g/dL   Albumin/Globulin Ratio 1.9 1.2 - 2.2   Bilirubin Total 0.4 0.0 - 1.2 mg/dL  Alkaline Phosphatase 43 (L) 44 - 121 IU/L   AST 15 0 - 40 IU/L   ALT 12 0 - 32 IU/L  Lipid panel  Result Value Ref Range   Cholesterol, Total 164 100 - 199 mg/dL   Triglycerides 45 0 - 149 mg/dL   HDL 47 >39 mg/dL   VLDL Cholesterol Cal 9 5 - 40 mg/dL   LDL Chol Calc (NIH) 108 (H) 0 - 99 mg/dL   Chol/HDL Ratio 3.5 0.0 - 4.4 ratio  Iron, TIBC and Ferritin Panel  Result Value Ref Range   Total Iron Binding Capacity 350 250 - 450 ug/dL   UIBC 282 131 - 425 ug/dL   Iron 68 27 - 159 ug/dL   Iron Saturation 19 15 - 55 %   Ferritin 28 15 - 150 ng/mL  TSH  Result Value Ref Range   TSH 1.590 0.450 - 4.500 uIU/mL  VITAMIN D 25 Hydroxy (Vit-D Deficiency, Fractures)  Result Value Ref Range   Vit D, 25-Hydroxy 84.8 30.0 - 100.0 ng/mL  Testosterone, Free, Total, SHBG  Result Value Ref  Range   Testosterone 26 13 - 71 ng/dL   Testosterone, Free WILL FOLLOW    Sex Hormone Binding 94.4 24.6 - 122.0 nmol/L  Progesterone  Result Value Ref Range   Progesterone 0.2 ng/mL  FSH/LH  Result Value Ref Range   LH 6.0 mIU/mL   FSH 7.4 mIU/mL  Estrogens, total  Result Value Ref Range   Estrogen WILL FOLLOW   B12  Result Value Ref Range   Vitamin B-12 674 232 - 1,245 pg/mL  POCT urinalysis dipstick  Result Value Ref Range   Color, UA yellow    Clarity, UA clear    Glucose, UA Negative Negative   Bilirubin, UA negative    Ketones, UA negative    Spec Grav, UA <=1.005 (A) 1.010 - 1.025   Blood, UA negative    pH, UA 7.5 5.0 - 8.0   Protein, UA Positive (A) Negative   Urobilinogen, UA 0.2 0.2 or 1.0 E.U./dL   Nitrite, UA negative    Leukocytes, UA Negative Negative   Appearance     Odor      Assessment & Plan     Routine health maintenance - Plan: CBC with Differential/Platelet, Comprehensive metabolic panel, Lipid panel, Testosterone, Free, Total, SHBG, Progesterone, FSH/LH, Estrogens, total, B12  Screening for blood or protein in urine - Plan: POCT urinalysis dipstick  History of anemia - Plan: Iron, TIBC and Ferritin Panel, TSH  History of sun-damaged skin - Plan: Ambulatory referral to Dermatology, CANCELED: Ambulatory referral to Dermatology  Exposure to tanning bed, initial encounter - Plan: Ambulatory referral to Dermatology, CANCELED: Ambulatory referral to Dermatology  Vitamin D deficiency - Plan: VITAMIN D 25 Hydroxy (Vit-D Deficiency, Fractures)   Should hear within two weeks from referral- if not please call the office.  Return if symptoms worsen or fail to improve, for at any time for any worsening symptoms, Go to Emergency room/ urgent care if worse.     The entirety of the information documented in the History of Present Illness, Review of Systems and Physical Exam were personally obtained by me. Portions of this information were initially  documented by the CMA and reviewed by me for thoroughness and accuracy.   Red Flags discussed. The patient was given clear instructions to go to ER or return to medical center if any red flags develop, symptoms do not improve, worsen or new problems develop. They  verbalized understanding.    Marcille Buffy, [place] [phone] (phone) [phone] (fax)  [place]

## 2020-08-01 NOTE — Patient Instructions (Signed)
Lymphadenopathy  Lymphadenopathy means that your lymph glands are swollen or larger than normal. Lymph glands, also called lymph nodes, are collections of tissue that filter excess fluid, bacteria, viruses, and waste from your bloodstream. They are part of your body's disease-fighting system (immune system), which protects your body from germs. There may be different causes of lymphadenopathy, depending on where it is in your body. Some types go away on their own. Lymphadenopathy can occur anywhere that you have lymph glands, including these areas:  Neck (cervical lymphadenopathy).  Chest (mediastinal lymphadenopathy).  Lungs (hilar lymphadenopathy).  Underarms (axillary lymphadenopathy).  Groin (inguinal lymphadenopathy). When your immune system responds to germs, infection-fighting cells and fluid build up in your lymph glands. This causes some swelling and enlargement. If the lymph nodes do not go back to normal size after you have an infection or disease, your health care provider may do tests. These tests help to monitor your condition and find the reason why the glands are still swollen and enlarged. Follow these instructions at home:  Get plenty of rest.  Your health care provider may recommend over-the-counter medicines for pain. Take over-the-counter and prescription medicines only as told by your health care provider.  If directed, apply heat to swollen lymph glands as often as told by your health care provider. Use the heat source that your health care provider recommends, such as a moist heat pack or a heating pad. ? Place a towel between your skin and the heat source. ? Leave the heat on for 20-30 minutes. ? Remove the heat if your skin turns bright red. This is especially important if you are unable to feel pain, heat, or cold. You may have a greater risk of getting burned.  Check your affected lymph glands every day for changes. Check other lymph gland areas as told by your  health care provider. Check for changes such as: ? More swelling. ? Sudden increase in size. ? Redness or pain. ? Hardness.  Keep all follow-up visits. This is important.   Contact a health care provider if you have:  Lymph glands that: ? Are still swollen after 2 weeks. ? Have suddenly gotten bigger or the swelling spreads. ? Are red, painful, or hard.  Fluid leaking from the skin near an enlarged lymph gland.  Problems with breathing.  A fever, chills, or night sweats.  Fatigue.  A sore throat.  Pain in your abdomen.  Weight loss. Get help right away if you have:  Severe pain.  Chest pain.  Shortness of breath. These symptoms may represent a serious problem that is an emergency. Do not wait to see if the symptoms will go away. Get medical help right away. Call your local emergency services (911 in the [place] Do not drive yourself to the hospital. Summary  Lymphadenopathy means that your lymph glands are swollen or larger than normal.  Lymph glands, also called lymph nodes, are collections of tissue that filter excess fluid, bacteria, viruses, and waste from the bloodstream. They are part of your body's disease-fighting system (immune system).  Lymphadenopathy can occur anywhere that you have lymph glands.  If the lymph nodes do not go back to normal size after you have an infection or disease, your health care provider may do tests to monitor your condition and find the reason why the glands are still swollen and enlarged.  Check your affected lymph glands every day for changes. Check other lymph gland areas as told by your health care provider. This information   is not intended to replace advice given to you by your health care provider. Make sure you discuss any questions you have with your health care provider. Document Revised: 03/29/2020 Document Reviewed: 03/29/2020 Elsevier Patient Education  [place] Maintenance, Female Adopting a healthy  lifestyle and getting preventive care are important in promoting health and wellness. Ask your health care provider about:  The right schedule for you to have regular tests and exams.  Things you can do on your own to prevent diseases and keep yourself healthy. What should I know about diet, weight, and exercise? Eat a healthy diet  Eat a diet that includes plenty of vegetables, fruits, low-fat dairy products, and lean protein.  Do not eat a lot of foods that are high in solid fats, added sugars, or sodium.   Maintain a healthy weight Body mass index (BMI) is used to identify weight problems. It estimates body fat based on height and weight. Your health care provider can help determine your BMI and help you achieve or maintain a healthy weight. Get regular exercise Get regular exercise. This is one of the most important things you can do for your health. Most adults should:  Exercise for at least 150 minutes each week. The exercise should increase your heart rate and make you sweat (moderate-intensity exercise).  Do strengthening exercises at least twice a week. This is in addition to the moderate-intensity exercise.  Spend less time sitting. Even light physical activity can be beneficial. Watch cholesterol and blood lipids Have your blood tested for lipids and cholesterol at 30 years of age, then have this test every 5 years. Have your cholesterol levels checked more often if:  Your lipid or cholesterol levels are high.  You are older than 30 years of age.  You are at high risk for heart disease. What should I know about cancer screening? Depending on your health history and family history, you may need to have cancer screening at various ages. This may include screening for:  Breast cancer.  Cervical cancer.  Colorectal cancer.  Skin cancer.  Lung cancer. What should I know about heart disease, diabetes, and high blood pressure? Blood pressure and heart disease  High  blood pressure causes heart disease and increases the risk of stroke. This is more likely to develop in people who have high blood pressure readings, are of African descent, or are overweight.  Have your blood pressure checked: ? Every 3-5 years if you are 41-46 years of age. ? Every year if you are 65 years old or older. Diabetes Have regular diabetes screenings. This checks your fasting blood sugar level. Have the screening done:  Once every three years after age 11 if you are at a normal weight and have a low risk for diabetes.  More often and at a younger age if you are overweight or have a high risk for diabetes. What should I know about preventing infection? Hepatitis B If you have a higher risk for hepatitis B, you should be screened for this virus. Talk with your health care provider to find out if you are at risk for hepatitis B infection. Hepatitis C Testing is recommended for:  Everyone born from [birth year] through 1965.  Anyone with known risk factors for hepatitis C. Sexually transmitted infections (STIs)  Get screened for STIs, including gonorrhea and chlamydia, if: ? You are sexually active and are younger than 30 years of age. ? You are older than 30 years of age and your  health care provider tells you that you are at risk for this type of infection. ? Your sexual activity has changed since you were last screened, and you are at increased risk for chlamydia or gonorrhea. Ask your health care provider if you are at risk.  Ask your health care provider about whether you are at high risk for HIV. Your health care provider may recommend a prescription medicine to help prevent HIV infection. If you choose to take medicine to prevent HIV, you should first get tested for HIV. You should then be tested every 3 months for as long as you are taking the medicine. Pregnancy  If you are about to stop having your period (premenopausal) and you may become pregnant, seek counseling before you  get pregnant.  Take 400 to 800 micrograms (mcg) of folic acid every day if you become pregnant.  Ask for birth control (contraception) if you want to prevent pregnancy. Osteoporosis and menopause Osteoporosis is a disease in which the bones lose minerals and strength with aging. This can result in bone fractures. If you are 13 years old or older, or if you are at risk for osteoporosis and fractures, ask your health care provider if you should:  Be screened for bone loss.  Take a calcium or vitamin D supplement to lower your risk of fractures.  Be given hormone replacement therapy (HRT) to treat symptoms of menopause. Follow these instructions at home: Lifestyle  Do not use any products that contain nicotine or tobacco, such as cigarettes, e-cigarettes, and chewing tobacco. If you need help quitting, ask your health care provider.  Do not use street drugs.  Do not share needles.  Ask your health care provider for help if you need support or information about quitting drugs. Alcohol use  Do not drink alcohol if: ? Your health care provider tells you not to drink. ? You are pregnant, may be pregnant, or are planning to become pregnant.  If you drink alcohol: ? Limit how much you use to 0-1 drink a day. ? Limit intake if you are breastfeeding.  Be aware of how much alcohol is in your drink. In the [place] one drink equals one 12 oz bottle of beer (355 mL), one 5 oz glass of wine (148 mL), or one 1 oz glass of hard liquor (44 mL). General instructions  Schedule regular health, dental, and eye exams.  Stay current with your vaccines.  Tell your health care provider if: ? You often feel depressed. ? You have ever been abused or do not feel safe at home. Summary  Adopting a healthy lifestyle and getting preventive care are important in promoting health and wellness.  Follow your health care provider's instructions about healthy diet, exercising, and getting tested or screened  for diseases.  Follow your health care provider's instructions on monitoring your cholesterol and blood pressure. This information is not intended to replace advice given to you by your health care provider. Make sure you discuss any questions you have with your health care provider. Document Revised: 05/27/2018 Document Reviewed: 05/27/2018 Elsevier Patient Education  2021 [employer].

## 2020-08-02 ENCOUNTER — Other Ambulatory Visit: Payer: Self-pay | Admitting: Adult Health

## 2020-08-02 DIAGNOSIS — J029 Acute pharyngitis, unspecified: Secondary | ICD-10-CM

## 2020-08-02 MED ORDER — AMOXICILLIN 875 MG PO TABS
875.0000 mg | ORAL_TABLET | Freq: Two times a day (BID) | ORAL | 0 refills | Status: DC
Start: 2020-08-02 — End: 2023-06-03

## 2020-08-02 NOTE — Progress Notes (Signed)
Meds ordered this encounter  Medications  . amoxicillin (AMOXIL) 875 MG tablet    Sig: Take 1 tablet (875 mg total) by mouth 2 (two) times daily.    Dispense:  20 tablet    Refill:  0  see my chart message.

## 2020-08-03 LAB — CBC WITH DIFFERENTIAL/PLATELET
Basophils Absolute: 0 10*3/uL (ref 0.0–0.2)
Basos: 0 %
EOS (ABSOLUTE): 0 10*3/uL (ref 0.0–0.4)
Eos: 0 %
Hematocrit: 37.8 % (ref 34.0–46.6)
Hemoglobin: 13.3 g/dL (ref 11.1–15.9)
Immature Grans (Abs): 0 10*3/uL (ref 0.0–0.1)
Immature Granulocytes: 0 %
Lymphocytes Absolute: 1.2 10*3/uL (ref 0.7–3.1)
Lymphs: 23 %
MCH: 31.4 pg (ref 26.6–33.0)
MCHC: 35.2 g/dL (ref 31.5–35.7)
MCV: 89 fL (ref 79–97)
Monocytes Absolute: 0.3 10*3/uL (ref 0.1–0.9)
Monocytes: 5 %
Neutrophils Absolute: 3.6 10*3/uL (ref 1.4–7.0)
Neutrophils: 72 %
Platelets: 270 10*3/uL (ref 150–450)
RBC: 4.23 x10E6/uL (ref 3.77–5.28)
RDW: 11.9 % (ref 11.7–15.4)
WBC: 5.2 10*3/uL (ref 3.4–10.8)

## 2020-08-03 LAB — COMPREHENSIVE METABOLIC PANEL
ALT: 12 IU/L (ref 0–32)
AST: 15 IU/L (ref 0–40)
Albumin/Globulin Ratio: 1.9 (ref 1.2–2.2)
Albumin: 4.6 g/dL (ref 3.9–5.0)
Alkaline Phosphatase: 43 IU/L — ABNORMAL LOW (ref 44–121)
BUN/Creatinine Ratio: 12 (ref 9–23)
BUN: 9 mg/dL (ref 6–20)
Bilirubin Total: 0.4 mg/dL (ref 0.0–1.2)
CO2: 23 mmol/L (ref 20–29)
Calcium: 9 mg/dL (ref 8.7–10.2)
Chloride: 100 mmol/L (ref 96–106)
Creatinine, Ser: 0.73 mg/dL (ref 0.57–1.00)
GFR calc Af Amer: 129 mL/min/{1.73_m2} (ref >59)
GFR calc non Af Amer: 112 mL/min/{1.73_m2} (ref >59)
Globulin, Total: 2.4 g/dL (ref 1.5–4.5)
Glucose: 75 mg/dL (ref 65–99)
Potassium: 4.6 mmol/L (ref 3.5–5.2)
Sodium: 141 mmol/L (ref 134–144)
Total Protein: 7 g/dL (ref 6.0–8.5)

## 2020-08-03 LAB — LIPID PANEL
Chol/HDL Ratio: 3.5 ratio (ref 0.0–4.4)
Cholesterol, Total: 164 mg/dL (ref 100–199)
HDL: 47 mg/dL (ref >39)
LDL Chol Calc (NIH): 108 mg/dL — ABNORMAL HIGH (ref 0–99)
Triglycerides: 45 mg/dL (ref 0–149)
VLDL Cholesterol Cal: 9 mg/dL (ref 5–40)

## 2020-08-03 LAB — TESTOSTERONE, FREE, TOTAL, SHBG
Sex Hormone Binding: 94.4 nmol/L (ref 24.6–122.0)
Testosterone, Free: 1.3 pg/mL (ref 0.0–4.2)
Testosterone: 26 ng/dL (ref 13–71)

## 2020-08-03 LAB — TSH: TSH: 1.59 u[IU]/mL (ref 0.450–4.500)

## 2020-08-03 LAB — FSH/LH
FSH: 7.4 m[IU]/mL
LH: 6 m[IU]/mL

## 2020-08-03 LAB — IRON,TIBC AND FERRITIN PANEL
Ferritin: 28 ng/mL (ref 15–150)
Iron Saturation: 19 % (ref 15–55)
Iron: 68 ug/dL (ref 27–159)
Total Iron Binding Capacity: 350 ug/dL (ref 250–450)
UIBC: 282 ug/dL (ref 131–425)

## 2020-08-03 LAB — VITAMIN B12: Vitamin B-12: 674 pg/mL (ref 232–1245)

## 2020-08-03 LAB — PROGESTERONE: Progesterone: 0.2 ng/mL

## 2020-08-03 LAB — VITAMIN D 25 HYDROXY (VIT D DEFICIENCY, FRACTURES): Vit D, 25-Hydroxy: 84.8 ng/mL (ref 30.0–100.0)

## 2020-08-03 LAB — ESTROGENS, TOTAL: Estrogen: 95 pg/mL

## 2020-08-03 NOTE — Progress Notes (Signed)
CBC is within normal limits, no anemia or signs of infection.  CMP is within normal limits, electrolytes, liver and kidney function.  LDL elevated mildly.  Increase exercise, fiber, fruits, vegetables, lean meat, decrease red meats,and omega 3/fish intake and decrease saturated fat.  Iron levels within normal limits.  TSH for thyroid within normal limits.  Vitamin D within normal limits.  B12 [place] within normal limits.  Hormone testing is within normal limits for her age, estrogen level still pending result, will release to Mychart once resulted.

## 2020-08-03 NOTE — Progress Notes (Signed)
Estrogen level within normal limits.

## 2020-12-05 ENCOUNTER — Other Ambulatory Visit
Admission: RE | Admit: 2020-12-05 | Discharge: 2020-12-05 | Disposition: A | Discharge status: Home / Self Care | Payer: 59 | Source: Ambulatory Visit | Attending: Student | Admitting: Student

## 2020-12-05 ENCOUNTER — Encounter: Payer: Self-pay | Admitting: Adult Health

## 2020-12-05 DIAGNOSIS — R55 Syncope and collapse: Secondary | ICD-10-CM | POA: Diagnosis not present

## 2020-12-05 DIAGNOSIS — R0602 Shortness of breath: Secondary | ICD-10-CM | POA: Diagnosis present

## 2020-12-05 LAB — D-DIMER, QUANTITATIVE: D-Dimer, Quant: <0.27 ug/mL-FEU (ref 0.00–0.50)

## 2020-12-05 NOTE — Telephone Encounter (Signed)
Called and spoke with the Patient. States that last night she was laying on the couch when she started to have blurred darkening vision, hyperventilation , and shortness of breath with dizziness. Patient stated it happened again this morning while she was driving.   Informed the Patient that Sharyn Lull is out of office and there is no availability today. Informed the Patient that she should be seen in person and someone else should drive her.   Patient verbalized understanding. States she will have family watch her children while her husband drives her to the urgent care. She will then call back in for a follow up visit with Sharyn Lull.

## 2020-12-06 NOTE — Telephone Encounter (Signed)
Called pt to check in. Pt states she was seen yesterday 6/21 and had labs, EKG and chest x-ray done and they all came back wnl. Pt states shes is feeling better.Pt says they placed a cardiology referral for her since she has a heart murmur. Pt states they believe it was due to anxiety. Pt refused medication at the time due to heart murmur and wants to rule everything out first. Pt will keep [place] updated and schedule a follow up once she's been seen by cardiology and in the clear. 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

## 2021-01-31 ENCOUNTER — Other Ambulatory Visit: Payer: Self-pay

## 2021-01-31 ENCOUNTER — Ambulatory Visit: Payer: 59 | Admitting: Dermatology

## 2021-01-31 ENCOUNTER — Encounter: Payer: Self-pay | Admitting: Dermatology

## 2021-01-31 DIAGNOSIS — D2271 Melanocytic nevi of right lower limb, including hip: Secondary | ICD-10-CM | POA: Diagnosis not present

## 2021-01-31 DIAGNOSIS — D229 Melanocytic nevi, unspecified: Secondary | ICD-10-CM

## 2021-01-31 DIAGNOSIS — Z1283 Encounter for screening for malignant neoplasm of skin: Secondary | ICD-10-CM

## 2021-01-31 DIAGNOSIS — L578 Other skin changes due to chronic exposure to nonionizing radiation: Secondary | ICD-10-CM | POA: Diagnosis not present

## 2021-01-31 DIAGNOSIS — L814 Other melanin hyperpigmentation: Secondary | ICD-10-CM

## 2021-01-31 DIAGNOSIS — D1800 Hemangioma unspecified site: Secondary | ICD-10-CM

## 2021-01-31 DIAGNOSIS — Z808 Family history of malignant neoplasm of other organs or systems: Secondary | ICD-10-CM

## 2021-01-31 NOTE — Progress Notes (Signed)
   New Patient Visit  Subjective  Marie Stephenson is a 30 y.o. female who presents for the following: Annual Exam (Hx of chronic sun exposure and tanning bed use. Requests TBSE. Great grandmother has H/O MM. ).  Patient here for full body skin exam and skin cancer screening.   Objective  Well appearing patient in no apparent distress; mood and affect are within normal limits.  Review of Systems: No other skin or systemic complaints except as noted in HPI or Assessment and Plan.   A full examination was performed including scalp, head, eyes, ears, nose, lips, neck, chest, axillae, abdomen, back, buttocks, bilateral upper extremities, bilateral lower extremities, hands, feet, fingers, toes, fingernails, and toenails. All findings within normal limits unless otherwise noted below.  Right Plantar Foot 2 brown macules with regular borders.  Assessment & Plan  Nevus Right Plantar Foot  Benign-appearing.  Observation.  Call clinic for new or changing lesions.  Recommend daily use of broad spectrum spf 30+ sunscreen to sun-exposed areas.    Lentigines - Scattered tan macules - Due to sun exposure - Benign-appering, observe - Recommend daily broad spectrum sunscreen SPF 30+ to sun-exposed areas, reapply every 2 hours as needed. - Call for any changes  Melanocytic Nevi - Tan-brown and/or pink-flesh-colored symmetric macules and papules - Benign appearing on exam today - Observation - Call clinic for new or changing moles - Recommend daily use of broad spectrum spf 30+ sunscreen to sun-exposed areas.   Hemangiomas - Red papules - Discussed benign nature - Observe - Call for any changes  Actinic Damage - Chronic condition, secondary to cumulative UV/sun exposure - diffuse scaly erythematous macules with underlying dyspigmentation - Recommend daily broad spectrum sunscreen SPF 30+ to sun-exposed areas, reapply every 2 hours as needed.  - Staying in the shade or wearing  long sleeves, sun glasses (UVA+UVB protection) and wide brim hats (4-inch brim around the entire circumference of the hat) are also recommended for sun protection.  - Call for new or changing lesions.  Skin cancer screening performed today.  Unable to view fingernails and toenails today. Check when remove polish.   Return for TBSE 2 years.  Call sooner for anything new or changing.  I, Emelia Salisbury, CMA, am acting as scribe for Forest Gleason, MD.  Documentation: I have reviewed the above documentation for accuracy and completeness, and I agree with the above.  Forest Gleason, MD

## 2021-01-31 NOTE — Patient Instructions (Addendum)
Melanoma ABCDEs  Melanoma is the most dangerous type of skin cancer, and is the leading cause of death from skin disease.  You are more likely to develop melanoma if you: Have light-colored skin, light-colored eyes, or red or blond hair Spend a lot of time in the sun Tan regularly, either outdoors or in a tanning bed Have had blistering sunburns, especially during childhood Have a close family member who has had a melanoma Have atypical moles or large birthmarks  Early detection of melanoma is key since treatment is typically straightforward and cure rates are extremely high if we catch it early.   The first sign of melanoma is often a change in a mole or a new dark spot.  The ABCDE system is a way of remembering the signs of melanoma.  A for asymmetry:  The two halves do not match. B for border:  The edges of the growth are irregular. C for color:  A mixture of colors are present instead of an even brown color. D for diameter:  Melanomas are usually (but not always) greater than 6mm - the size of a pencil eraser. E for evolution:  The spot keeps changing in size, shape, and color.  Please check your skin once per month between visits. You can use a small mirror in front and a large mirror behind you to keep an eye on the back side or your body.   If you see any new or changing lesions before your next follow-up, please call to schedule a visit.  Please continue daily skin protection including broad spectrum sunscreen SPF 30+ to sun-exposed areas, reapplying every 2 hours as needed when you're outdoors.   Staying in the shade or wearing long sleeves, sun glasses (UVA+UVB protection) and wide brim hats (4-inch brim around the entire circumference of the hat) are also recommended for sun protection.    If you have any questions or concerns for your doctor, please call our main line at [phone] and press option 4 to reach your doctor's medical assistant. If no one answers, please leave a  voicemail as directed and we will return your call as soon as possible. Messages left after 4 pm will be answered the following business day.   You may also send us a message via MyChart. We typically respond to MyChart messages within 1-2 business days.  For prescription refills, please ask your pharmacy to contact our office. Our fax number is [phone].  If you have an urgent issue when the clinic is closed that cannot wait until the next business day, you can page your doctor at the number below.    Please note that while we do our best to be available for urgent issues outside of office hours, we are not available 24/7.   If you have an urgent issue and are unable to reach us, you may choose to seek medical care at your doctor's office, retail clinic, urgent care center, or emergency room.  If you have a medical emergency, please immediately call 911 or go to the emergency department.  Pager Numbers  - Dr. Kowalski: [phone]  - Dr. Moye: [phone]  - Dr. Stewart: [phone]  In the event of inclement weather, please call our main line at [phone] for an update on the status of any delays or closures.  Dermatology Medication Tips: Please keep the boxes that topical medications come in in order to help keep track of the instructions about where and how to use these. Pharmacies typically   print the medication instructions only on the boxes and not directly on the medication tubes.   If your medication is too expensive, please contact our office at [phone] option 4 or send [place] a message through [place].   We are unable to tell what your co-pay for medications will be in advance as this is different depending on your insurance coverage. However, we may be able to find a substitute medication at lower cost or fill out paperwork to get insurance to cover a needed medication.   If a prior authorization is required to get your medication covered by your insurance  company, please allow [place] 1-2 business days to complete this process.  Drug prices often vary depending on where the prescription is filled and some pharmacies may offer cheaper prices.  The website www.goodrx.com contains coupons for medications through different pharmacies. The prices here do not account for what the cost may be with help from insurance (it may be cheaper with your insurance), but the website can give you the price if you did not use any insurance.  - You can print the associated coupon and take it with your prescription to the pharmacy.  - You may also stop by our office during regular business hours and pick up a GoodRx coupon card.  - If you need your prescription sent electronically to a different pharmacy, notify our office through [facility] or by phone at [phone] option 4.  Recommend taking Heliocare sun protection supplement daily in sunny weather for additional sun protection. For maximum protection on the sunniest days, you can take up to 2 capsules of regular Heliocare OR take 1 capsule of Heliocare Ultra. For prolonged exposure (such as a full day in the sun), you can repeat your dose of the supplement 4 hours after your first dose. Heliocare can be purchased at [facility] or at [email or web address].

## 2021-02-05 ENCOUNTER — Encounter: Payer: Self-pay | Admitting: Dermatology

## 2021-08-21 IMAGING — US US MFM OB FOLLOW-UP EACH ADDL GEST (MODIFY)
1 series · 12 of 28 positions shown · non-contrast
Comparison: none

PATIENT INFO:

             [facility]
PERFORMED BY:
SERVICE(S) PROVIDED:
 ----------------------------------------------------------------------
INDICATIONS:
  35 weeks gestation of pregnancy
FETAL EVALUATION (FETUS A):
 Num Of Fetuses:         2
 Fetal Heart Rate(bpm):  155
 Cardiac Activity:       Present
 Presentation:           Vertex
 Placenta:               Posterior
 AFI Sum(cm)     %Tile       Largest Pocket(cm)
 4.72            < 3
 RUQ(cm)
BIOMETRY (FETUS A):
 BPD:      85.4  mm     G. Age:  34w 3d         26  %    CI:        75.02   %    70 - 86
                                                         FL/HC:      21.7   %    20.1 -
 HC:      312.8  mm     G. Age:  35w 0d         13  %    HC/AC:      1.01        0.93 -
 AC:      309.5  mm     G. Age:  34w 6d         43  %    FL/BPD:     79.5   %    71 - 87
 FL:       67.9  mm     G. Age:  34w 6d         30  %    FL/AC:      21.9   %    20 - 24
 HUM:      60.1  mm     G. Age:  35w 0d         53  %
 Est. FW:    1500  gm      5 lb 9 oz     36  %     FW Discordancy         9  %
GESTATIONAL AGE (FETUS A):
 LMP:           35w 3d        Date:  02/26/19                 EDD:   12/03/19
 U/S Today:     34w 6d                                        EDD:   12/07/19
 Best:          35w 3d     Det. By:  LMP  (02/26/19)          EDD:   12/03/19
ANATOMY (FETUS A):
 Cavum:                 Within Normal Limits   Stomach:                Seen
 Heart:                 4-Chamber view         Kidneys:                Normal appearance
                        appears normal
 RVOT:                  Normal appearance      Bladder:                Seen
 LVOT:                  Normal appearance
FETAL EVALUATION (FETUS B):
 Fetal Heart Rate(bpm):  160
 Presentation:           Transverse, spine up, head right
 Placenta:               Anterior
 4.59            < 3
BIOMETRY (FETUS B):
 BPD:      89.9  mm     G. Age:  36w 3d         80  %    CI:        76.89   %    70 - 86
                                                         FL/HC:      21.3   %    20.1 -
 HC:      324.7  mm     G. Age:  36w 5d         51  %    HC/AC:      1.02        0.93 -
 AC:      317.7  mm     G. Age:  35w 5d         65  %    FL/BPD:     76.9   %    71 - 87
 FL:       69.1  mm     G. Age:  35w 3d         45  %    FL/AC:      21.8   %    20 - 24
 HUM:      61.9  mm     G. Age:  35w 6d         73  %
 Est. FW:    5222  gm      6 lb 2 oz     58  %     FW Discordancy      0 \ 9 %
GESTATIONAL AGE (FETUS B):
 U/S Today:     36w 1d                                        EDD:   11/28/19
ANATOMY (FETUS B):
 Cavum:                 Within Normal Limits   Kidneys:                Normal appearance
 Heart:                 4-Chamber view         Bladder:                Seen
 Stomach:               Seen

[Series 1: us mfm ob follow-up each addl gest (modify) · 0.25mm/px · 12 of 50 slices shown]
[im 2/50]
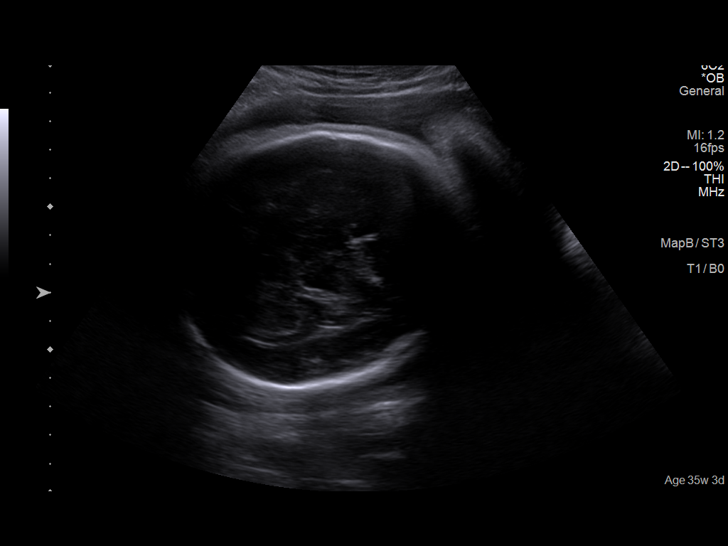
[im 6/50]
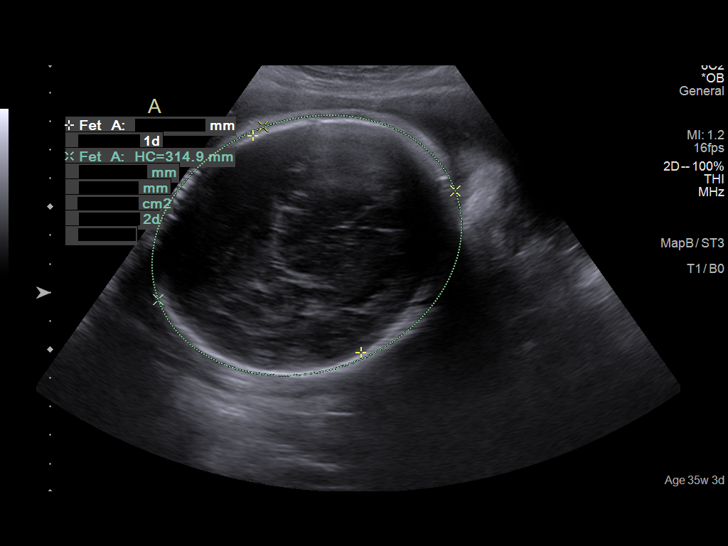
[im 10/50]
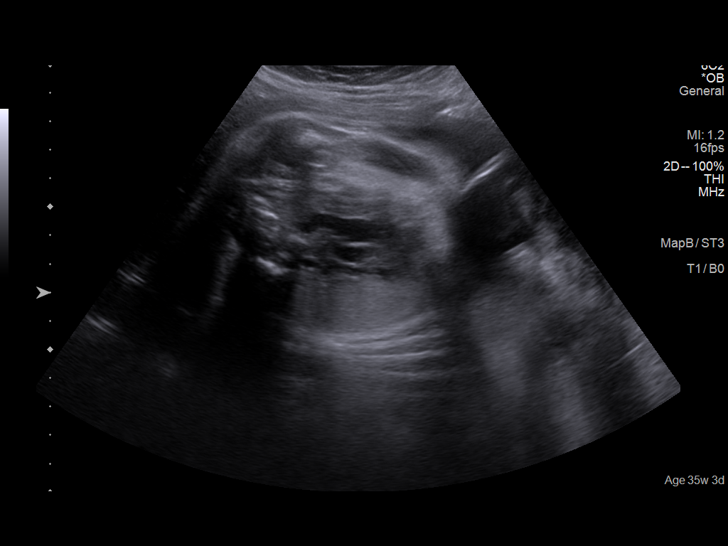
[im 15/50]
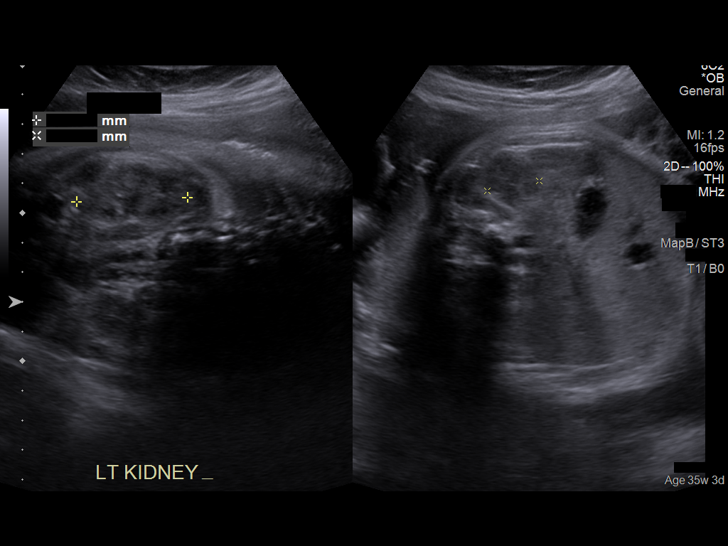
[im 19/50]
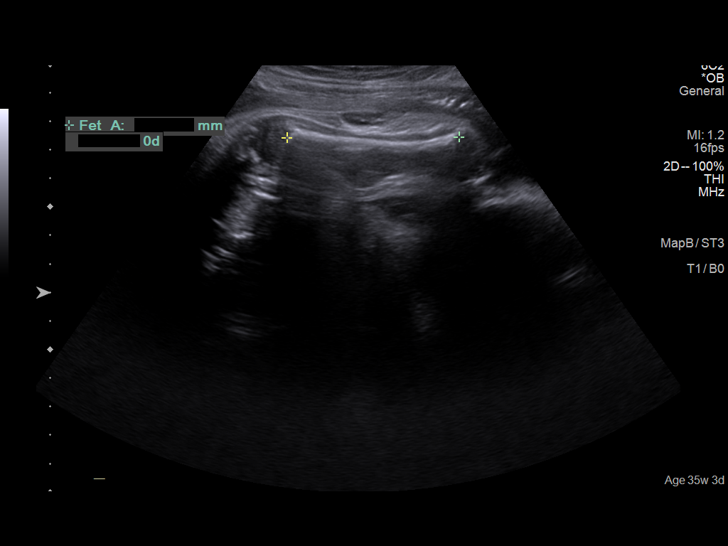
[im 22/50]
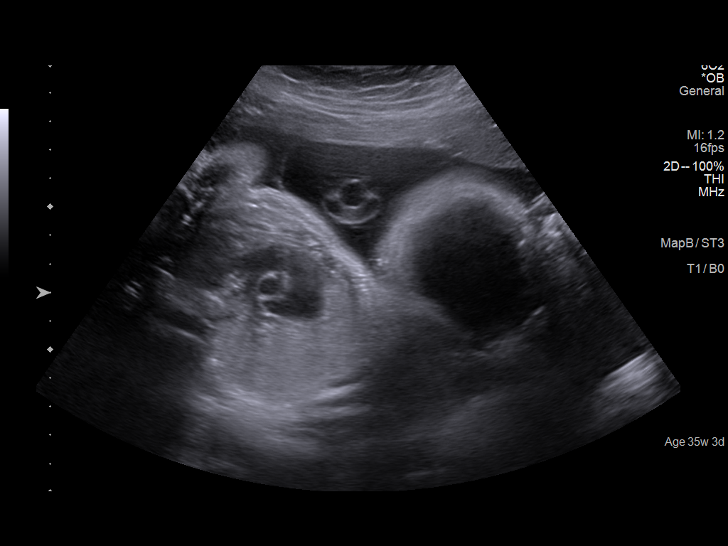
[im 28/50]
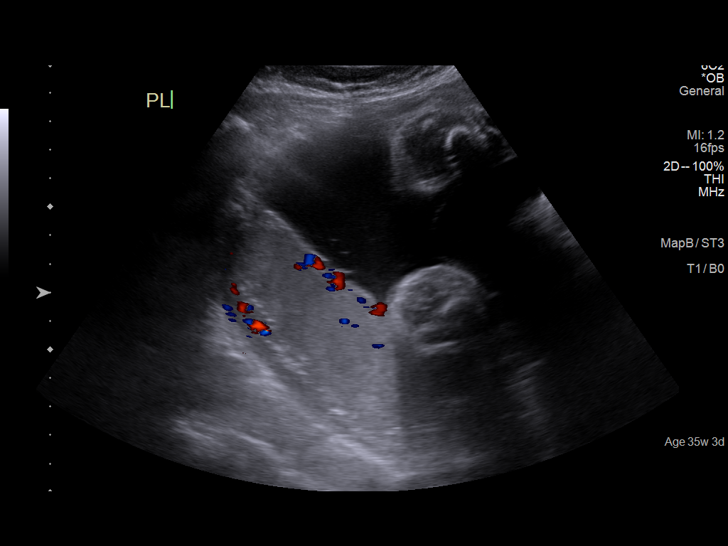
[im 31/50]
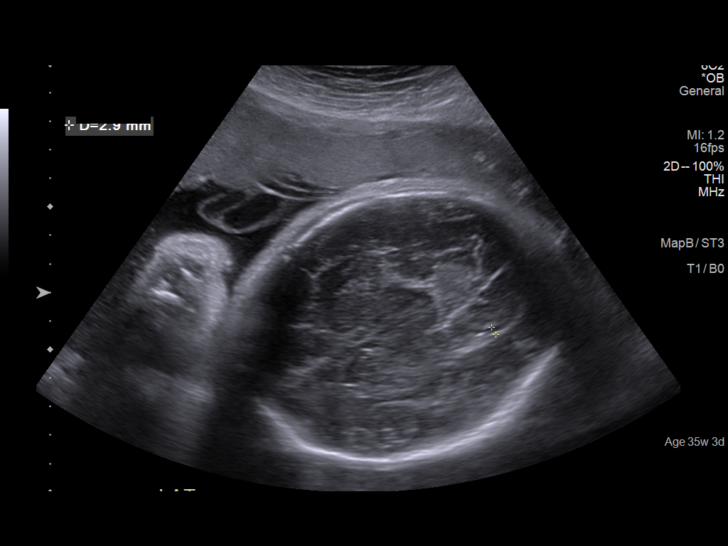
[im 35/50]
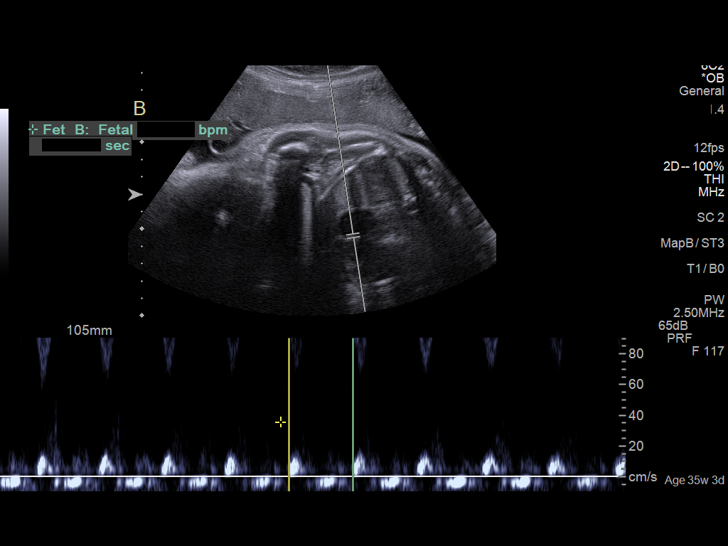
[im 40/50]
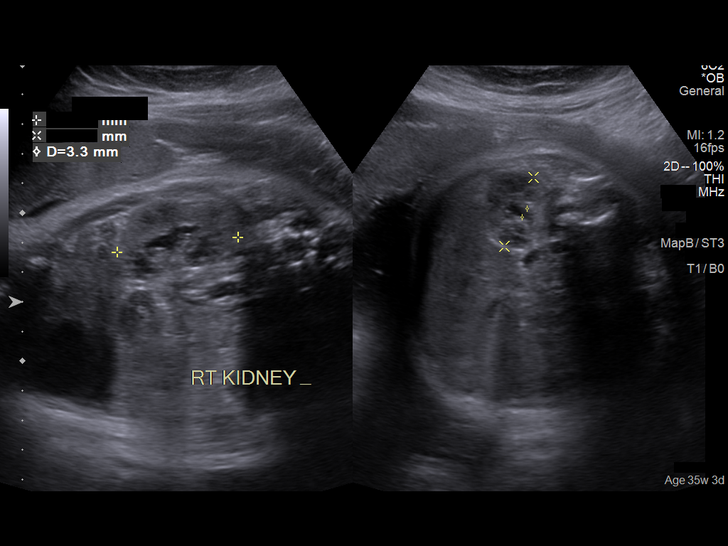
[im 44/50]
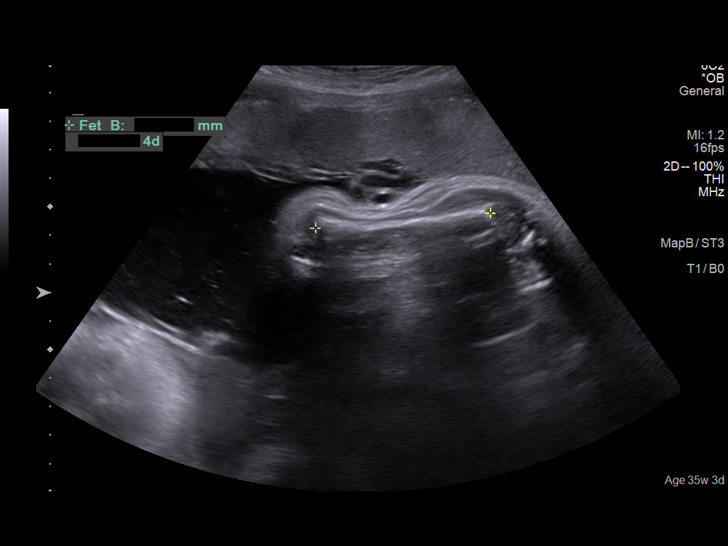
[im 48/50]
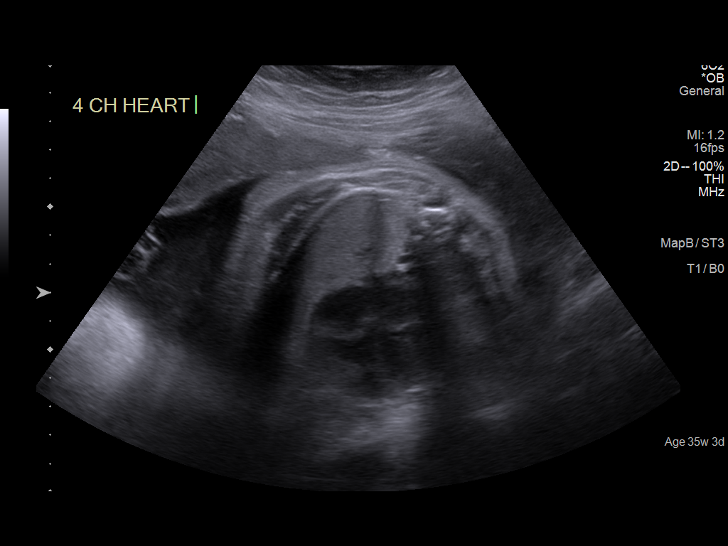

[12 of 28 positions shown; findings below may reference images not displayed]

IMPRESSION: Dear  Colleagues,

 Thank you for referring your patient for ultrasound for twin
 growth evaluation.
 She is currently at 35w 3d by LMP consistent with an early
 ultrasound performed at [facility] on 04/21/2019 at 7w 5d.

 A Dichorionic Diamniotic twin pregnancy is identified with a
 thick  dividing membrane .

 Twin A is located on the maternal   right side posterior
 placenta).
 Twin A is cephalic.   EFW 2533g, 36th percentile
 Female sex noted on outside reports - genitalia not well seen
 today.
 The MVP for Twin A is 4.7   cm.
 Visualized anatomy appears normal.
 Incidental BPP [DATE].

 Twin B is located on the maternal  left    side (anterior
 placenta).
  Twin B is transverse. EFW 2777g,  58th percentile
 Male sex re-documented today
 The MVP for Twin B is 4.6  cm.
 Visualized anatomy appears normal today , limited views of
 the heart , extremities and abd CI due to advanced gest Age .
 Incidental BPP [DATE].

 Both twins are appropriately grown.

 The amniotic fluid is within normal limits in each sac.

 Continue weekly antetnatlal testing.  Agree with reported
 plan for delivery in 37-38 week period.

 Thank you for allowing us to participate in your patient's care.
                  [place], Binky

## 2023-06-03 ENCOUNTER — Encounter: Payer: Self-pay | Admitting: Podiatry

## 2023-06-03 ENCOUNTER — Ambulatory Visit (INDEPENDENT_AMBULATORY_CARE_PROVIDER_SITE_OTHER): Payer: Commercial Managed Care - PPO | Admitting: Podiatry

## 2023-06-03 VITALS — Ht 61.0 in | Wt 132.0 lb

## 2023-06-03 DIAGNOSIS — Z79899 Other long term (current) drug therapy: Secondary | ICD-10-CM

## 2023-06-03 DIAGNOSIS — B351 Tinea unguium: Secondary | ICD-10-CM | POA: Diagnosis not present

## 2023-06-03 NOTE — Progress Notes (Unsigned)
  Subjective:  Patient ID: Marie Stephenson, female    DOB: January 30, 1991,  MRN: [id number]  Chief Complaint  Patient presents with   Nail Problem    Pt is here due to toenail fungus on bilateral greater toenails.    32 y.o. female presents with the above complaint.  Patient presents with bilateral hallux thickened onychodystrophy mycotic nail x 2.  She will be reevaluated.  She is try some topical which has not helped.  She would like to discuss treatment options for pes cavus 0 out of 10.  Nothing has helped to treat the nail fungus.  She would like to discuss oral medication   Review of Systems: Negative except as noted in the HPI. Denies N/V/F/Ch.  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  Social History   Tobacco Use  Smoking Status Never  Smokeless Tobacco Never    No Known Allergies Objective:  There were no vitals filed for this visit. Body mass index is 24.94 kg/m. Constitutional Well developed. Well nourished.  Vascular Dorsalis pedis pulses palpable bilaterally. Posterior tibial pulses palpable bilaterally. Capillary refill normal to all digits.  No cyanosis or clubbing noted. Pedal hair growth normal.  Neurologic Normal speech. Oriented to person, place, and time. Epicritic sensation to light touch grossly present bilaterally.  Dermatologic Nails thickened and again dystrophic mycotic toenails x 2 bilateral hallux Skin within normal limits  Orthopedic: Normal joint ROM without pain or crepitus bilaterally. No visible deformities. No bony tenderness.   Radiographs: None Assessment:   1. Long-term use of high-risk medication   2. Nail fungus   3. Onychomycosis due to dermatophyte    Plan:  Patient was evaluated and  treated and all questions answered.  Bilateral hallux onychomycosis -Educated the patient on the etiology of onychomycosis and various treatment options associated with improving the fungal load.  I explained to the patient that there is 3 treatment options available to treat the onychomycosis including topical, p.o., laser treatment.  Patient elected to undergo p.o. options with Lamisil/terbinafine therapy.  In order for me to start the medication therapy, I explained to the patient the importance of evaluating the liver and obtaining the liver function test.  Once the liver function test comes back normal I will start him on 59-month course of Lamisil therapy.  Patient understood all risk and would like to proceed with Lamisil therapy.  I have asked the patient to immediately stop the Lamisil therapy if she has any reactions to it and call the office or go to the emergency room right away.  Patient states understanding   No follow-ups on file.
